# Patient Record
Sex: Female | Born: 1985 | Hispanic: No | Marital: Single | Smoking: Never smoker
Health system: Southern US, Community
[De-identification: ages and names within clinical notes are randomized; demographics above are authoritative.]

## PROBLEM LIST (undated history)

## (undated) DIAGNOSIS — F419 Anxiety disorder, unspecified: Secondary | ICD-10-CM

## (undated) DIAGNOSIS — F909 Attention-deficit hyperactivity disorder, unspecified type: Secondary | ICD-10-CM

## (undated) DIAGNOSIS — J45909 Unspecified asthma, uncomplicated: Secondary | ICD-10-CM

## (undated) HISTORY — PX: LIPOSUCTION: SHX10

## (undated) HISTORY — PX: BREAST ENHANCEMENT SURGERY: SHX7

---

## 2020-10-12 NOTE — L&D Delivery Note (Signed)
Delivery Note At 11:31 AM a viable and healthy female was delivered via Vaginal, Spontaneous (Presentation: Right occiput anterior ).  APGAR: 8, 9; weight pending .   Placenta status:  spontaneous, intact .  Cord: 3V    The patient pushed for 16 minutes and delivered a vigorous female infant in the ROA presentation with apgar scores of 8 at 1 minute and 9 at 5 minutes. Prior to delivery thick meconium stained fluid was noted and the NICU team was called to attend the delivery as a precaution. The fetal head was easily delivered followed by the anterior and posterior shoulders. The infant was immediately passed to the maternal abdomen, was bulb suctioned, and cried easily. The NICU team was not required. Following a 1 minute delay, the cord was clamped and cut. The placenta delivered spontaneously, intact, with 3VC. A second degree perineal laceration was repaired with 3-0 vicryl. All sponge, needle, and instrument counts were correct. EBL 449 cc  Anesthesia: Epidural Episiotomy: None Lacerations:  2nd degree perineal  Suture Repair: 3.0 vicryl Est. Blood Loss (mL):  449 cc  Mom to postpartum.  Baby to Couplet care / Skin to Skin.  Waynard Reeds 05/07/2021, 11:54 AM

## 2021-03-05 LAB — OB RESULTS CONSOLE VARICELLA ZOSTER ANTIBODY, IGG: Varicella: IMMUNE

## 2021-03-05 LAB — OB RESULTS CONSOLE RUBELLA ANTIBODY, IGM: Rubella: NON-IMMUNE/NOT IMMUNE

## 2021-03-05 LAB — OB RESULTS CONSOLE GC/CHLAMYDIA
Chlamydia: NEGATIVE
Gonorrhea: NEGATIVE

## 2021-03-05 LAB — OB RESULTS CONSOLE HIV ANTIBODY (ROUTINE TESTING): HIV: NONREACTIVE

## 2021-03-05 LAB — OB RESULTS CONSOLE HEPATITIS B SURFACE ANTIGEN: Hepatitis B Surface Ag: NEGATIVE

## 2021-03-05 LAB — OB RESULTS CONSOLE RPR: RPR: NONREACTIVE

## 2021-04-09 LAB — OB RESULTS CONSOLE GBS: GBS: NEGATIVE

## 2021-04-11 ENCOUNTER — Inpatient Hospital Stay (HOSPITAL_COMMUNITY)
Admission: AD | Admit: 2021-04-11 | Payer: Medicaid Other | Source: Home / Self Care | Admitting: Obstetrics and Gynecology

## 2021-05-07 ENCOUNTER — Inpatient Hospital Stay (HOSPITAL_COMMUNITY): Payer: Medicaid Other | Admitting: Anesthesiology

## 2021-05-07 ENCOUNTER — Encounter (HOSPITAL_COMMUNITY): Payer: Self-pay | Admitting: Obstetrics and Gynecology

## 2021-05-07 ENCOUNTER — Inpatient Hospital Stay (HOSPITAL_COMMUNITY)
Admission: AD | Admit: 2021-05-07 | Discharge: 2021-05-09 | DRG: 806 | Disposition: A | Discharge status: Home / Self Care | Payer: Medicaid Other | Attending: Obstetrics and Gynecology | Admitting: Obstetrics and Gynecology

## 2021-05-07 ENCOUNTER — Other Ambulatory Visit: Payer: Self-pay

## 2021-05-07 DIAGNOSIS — O26893 Other specified pregnancy related conditions, third trimester: Secondary | ICD-10-CM | POA: Diagnosis present

## 2021-05-07 DIAGNOSIS — Z20822 Contact with and (suspected) exposure to covid-19: Secondary | ICD-10-CM | POA: Diagnosis present

## 2021-05-07 DIAGNOSIS — D62 Acute posthemorrhagic anemia: Secondary | ICD-10-CM | POA: Diagnosis not present

## 2021-05-07 DIAGNOSIS — O9081 Anemia of the puerperium: Secondary | ICD-10-CM | POA: Diagnosis not present

## 2021-05-07 DIAGNOSIS — Z23 Encounter for immunization: Secondary | ICD-10-CM

## 2021-05-07 DIAGNOSIS — Z3A40 40 weeks gestation of pregnancy: Secondary | ICD-10-CM | POA: Diagnosis not present

## 2021-05-07 HISTORY — DX: Anxiety disorder, unspecified: F41.9

## 2021-05-07 HISTORY — DX: Unspecified asthma, uncomplicated: J45.909

## 2021-05-07 LAB — RESP PANEL BY RT-PCR (FLU A&B, COVID) ARPGX2
Influenza A by PCR: NEGATIVE
Influenza B by PCR: NEGATIVE
SARS Coronavirus 2 by RT PCR: NEGATIVE

## 2021-05-07 LAB — CBC
HCT: 35.8 % — ABNORMAL LOW (ref 36.0–46.0)
Hemoglobin: 12.2 g/dL (ref 12.0–15.0)
MCH: 31 pg (ref 26.0–34.0)
MCHC: 34.1 g/dL (ref 30.0–36.0)
MCV: 90.9 fL (ref 80.0–100.0)
Platelets: 296 10*3/uL (ref 150–400)
RBC: 3.94 MIL/uL (ref 3.87–5.11)
RDW: 13.5 % (ref 11.5–15.5)
WBC: 13.7 10*3/uL — ABNORMAL HIGH (ref 4.0–10.5)
nRBC: 0 % (ref 0.0–0.2)

## 2021-05-07 LAB — TYPE AND SCREEN
ABO/RH(D): O POS
Antibody Screen: NEGATIVE

## 2021-05-07 LAB — POCT FERN TEST: POCT Fern Test: POSITIVE

## 2021-05-07 LAB — RPR: RPR Ser Ql: NONREACTIVE

## 2021-05-07 MED ORDER — IBUPROFEN 600 MG PO TABS
600.0000 mg | ORAL_TABLET | Freq: Four times a day (QID) | ORAL | Status: DC
  Administered 2021-05-07 – 2021-05-09 (×6): 600 mg via ORAL
  Filled 2021-05-07 (×7): qty 1

## 2021-05-07 MED ORDER — LACTATED RINGERS IV SOLN: 500.0000 mL | INTRAVENOUS | Status: DC | PRN

## 2021-05-07 MED ORDER — FENTANYL-BUPIVACAINE-NACL 0.5-0.125-0.9 MG/250ML-% EP SOLN
12.0000 mL/h | EPIDURAL | Status: DC | PRN
Start: 2021-05-07 — End: 2021-05-07
  Administered 2021-05-07: 12 mL/h via EPIDURAL
  Filled 2021-05-07: qty 250

## 2021-05-07 MED ORDER — BUTORPHANOL TARTRATE 1 MG/ML IJ SOLN
1.0000 mg | INTRAMUSCULAR | Status: DC | PRN
  Administered 2021-05-07 (×2): 1 mg via INTRAVENOUS
  Filled 2021-05-07 (×2): qty 1

## 2021-05-07 MED ORDER — LIDOCAINE-EPINEPHRINE (PF) 2 %-1:200000 IJ SOLN
INTRAMUSCULAR | Status: DC | PRN
  Administered 2021-05-07: 5 mL via EPIDURAL

## 2021-05-07 MED ORDER — ONDANSETRON HCL 4 MG/2ML IJ SOLN: 4.0000 mg | Freq: Four times a day (QID) | INTRAMUSCULAR | Status: DC | PRN

## 2021-05-07 MED ORDER — METHYLERGONOVINE MALEATE 0.2 MG/ML IJ SOLN: 0.2000 mg | INTRAMUSCULAR | Status: DC | PRN

## 2021-05-07 MED ORDER — FLEET ENEMA 7-19 GM/118ML RE ENEM: 1.0000 | ENEMA | RECTAL | Status: DC | PRN

## 2021-05-07 MED ORDER — OXYCODONE HCL 5 MG PO TABS: 10.0000 mg | ORAL_TABLET | ORAL | Status: DC | PRN

## 2021-05-07 MED ORDER — BENZOCAINE-MENTHOL 20-0.5 % EX AERO
1.0000 "application " | INHALATION_SPRAY | CUTANEOUS | Status: DC | PRN
  Administered 2021-05-07: 1 via TOPICAL
  Filled 2021-05-07: qty 56

## 2021-05-07 MED ORDER — PRENATAL MULTIVITAMIN CH
1.0000 | ORAL_TABLET | Freq: Every day | ORAL | Status: DC
  Administered 2021-05-08: 1 via ORAL
  Filled 2021-05-07: qty 1

## 2021-05-07 MED ORDER — LACTATED RINGERS IV SOLN
500.0000 mL | Freq: Once | INTRAVENOUS | Status: AC
  Administered 2021-05-07: 500 mL via INTRAVENOUS

## 2021-05-07 MED ORDER — OXYCODONE-ACETAMINOPHEN 5-325 MG PO TABS: 2.0000 | ORAL_TABLET | ORAL | Status: DC | PRN

## 2021-05-07 MED ORDER — OXYCODONE HCL 5 MG PO TABS: 5.0000 mg | ORAL_TABLET | ORAL | Status: DC | PRN

## 2021-05-07 MED ORDER — ONDANSETRON HCL 4 MG/2ML IJ SOLN: 4.0000 mg | INTRAMUSCULAR | Status: DC | PRN

## 2021-05-07 MED ORDER — SOD CITRATE-CITRIC ACID 500-334 MG/5ML PO SOLN: 30.0000 mL | ORAL | Status: DC | PRN

## 2021-05-07 MED ORDER — DIBUCAINE (PERIANAL) 1 % EX OINT: 1.0000 "application " | TOPICAL_OINTMENT | CUTANEOUS | Status: DC | PRN

## 2021-05-07 MED ORDER — LACTATED RINGERS IV SOLN: INTRAVENOUS | Status: DC

## 2021-05-07 MED ORDER — TETANUS-DIPHTH-ACELL PERTUSSIS 5-2.5-18.5 LF-MCG/0.5 IM SUSY: 0.5000 mL | PREFILLED_SYRINGE | Freq: Once | INTRAMUSCULAR | Status: DC

## 2021-05-07 MED ORDER — OXYTOCIN-SODIUM CHLORIDE 30-0.9 UT/500ML-% IV SOLN: 2.5000 [IU]/h | INTRAVENOUS | Status: DC

## 2021-05-07 MED ORDER — ACETAMINOPHEN 325 MG PO TABS
650.0000 mg | ORAL_TABLET | ORAL | Status: DC | PRN
  Administered 2021-05-07 – 2021-05-09 (×2): 650 mg via ORAL
  Filled 2021-05-07: qty 2

## 2021-05-07 MED ORDER — SENNOSIDES-DOCUSATE SODIUM 8.6-50 MG PO TABS
2.0000 | ORAL_TABLET | Freq: Every day | ORAL | Status: DC
  Administered 2021-05-08: 2 via ORAL
  Filled 2021-05-07: qty 2

## 2021-05-07 MED ORDER — ACETAMINOPHEN 325 MG PO TABS: 650.0000 mg | ORAL_TABLET | ORAL | Status: DC | PRN

## 2021-05-07 MED ORDER — LIDOCAINE HCL (PF) 1 % IJ SOLN: 30.0000 mL | INTRAMUSCULAR | Status: DC | PRN

## 2021-05-07 MED ORDER — WITCH HAZEL-GLYCERIN EX PADS: 1.0000 "application " | MEDICATED_PAD | CUTANEOUS | Status: DC | PRN

## 2021-05-07 MED ORDER — PHENYLEPHRINE 40 MCG/ML (10ML) SYRINGE FOR IV PUSH (FOR BLOOD PRESSURE SUPPORT)
80.0000 ug | PREFILLED_SYRINGE | INTRAVENOUS | Status: DC | PRN
Start: 2021-05-07 — End: 2021-05-07

## 2021-05-07 MED ORDER — ONDANSETRON HCL 4 MG PO TABS: 4.0000 mg | ORAL_TABLET | ORAL | Status: DC | PRN

## 2021-05-07 MED ORDER — DIPHENHYDRAMINE HCL 50 MG/ML IJ SOLN
12.5000 mg | INTRAMUSCULAR | Status: DC | PRN
Start: 2021-05-07 — End: 2021-05-07

## 2021-05-07 MED ORDER — DIPHENHYDRAMINE HCL 25 MG PO CAPS: 25.0000 mg | ORAL_CAPSULE | Freq: Four times a day (QID) | ORAL | Status: DC | PRN

## 2021-05-07 MED ORDER — EPHEDRINE 5 MG/ML INJ
10.0000 mg | INTRAVENOUS | Status: DC | PRN
Start: 2021-05-07 — End: 2021-05-07

## 2021-05-07 MED ORDER — METHYLERGONOVINE MALEATE 0.2 MG PO TABS: 0.2000 mg | ORAL_TABLET | ORAL | Status: DC | PRN

## 2021-05-07 MED ORDER — OXYCODONE-ACETAMINOPHEN 5-325 MG PO TABS: 1.0000 | ORAL_TABLET | ORAL | Status: DC | PRN

## 2021-05-07 MED ORDER — ZOLPIDEM TARTRATE 5 MG PO TABS: 5.0000 mg | ORAL_TABLET | Freq: Every evening | ORAL | Status: DC | PRN

## 2021-05-07 MED ORDER — PHENYLEPHRINE 40 MCG/ML (10ML) SYRINGE FOR IV PUSH (FOR BLOOD PRESSURE SUPPORT): 80.0000 ug | PREFILLED_SYRINGE | INTRAVENOUS | Status: DC | PRN

## 2021-05-07 MED ORDER — OXYTOCIN-SODIUM CHLORIDE 30-0.9 UT/500ML-% IV SOLN: 2.5000 [IU]/h | INTRAVENOUS | Status: DC | PRN

## 2021-05-07 MED ORDER — EPHEDRINE 5 MG/ML INJ: 10.0000 mg | INTRAVENOUS | Status: DC | PRN

## 2021-05-07 MED ORDER — SIMETHICONE 80 MG PO CHEW: 80.0000 mg | CHEWABLE_TABLET | ORAL | Status: DC | PRN

## 2021-05-07 MED ORDER — COCONUT OIL OIL: 1.0000 "application " | TOPICAL_OIL | Status: DC | PRN

## 2021-05-07 MED ORDER — OXYTOCIN-SODIUM CHLORIDE 30-0.9 UT/500ML-% IV SOLN
1.0000 m[IU]/min | INTRAVENOUS | Status: DC
  Administered 2021-05-07: 2 m[IU]/min via INTRAVENOUS
  Filled 2021-05-07: qty 500

## 2021-05-07 MED ORDER — OXYTOCIN BOLUS FROM INFUSION
333.0000 mL | Freq: Once | INTRAVENOUS | Status: AC
  Administered 2021-05-07: 333 mL via INTRAVENOUS

## 2021-05-07 NOTE — Anesthesia Procedure Notes (Signed)
Epidural Patient location during procedure: OB Start time: 05/07/2021 7:30 AM End time: 05/07/2021 7:40 AM  Staffing Anesthesiologist: Elmer Picker, MD Performed: anesthesiologist   Preanesthetic Checklist Completed: patient identified, IV checked, risks and benefits discussed, monitors and equipment checked, pre-op evaluation and timeout performed  Epidural Patient position: sitting Prep: DuraPrep and site prepped and draped Patient monitoring: continuous pulse ox, blood pressure, heart rate and cardiac monitor Approach: midline Location: L3-L4 Injection technique: LOR air  Needle:  Needle type: Tuohy  Needle gauge: 17 G Needle length: 9 cm Needle insertion depth: 5 cm Catheter type: closed end flexible Catheter size: 19 Gauge Catheter at skin depth: 10 cm Test dose: negative  Assessment Sensory level: T8 Events: blood not aspirated, injection not painful, no injection resistance, no paresthesia and negative IV test  Additional Notes Patient identified. Risks/Benefits/Options discussed with patient including but not limited to bleeding, infection, nerve damage, paralysis, failed block, incomplete pain control, headache, blood pressure changes, nausea, vomiting, reactions to medication both or allergic, itching and postpartum back pain. Confirmed with bedside nurse the patient's most recent platelet count. Confirmed with patient that they are not currently taking any anticoagulation, have any bleeding history or any family history of bleeding disorders. Patient expressed understanding and wished to proceed. All questions were answered. Sterile technique was used throughout the entire procedure. Please see nursing notes for vital signs. Test dose was given through epidural catheter and negative prior to continuing to dose epidural or start infusion. Warning signs of high block given to the patient including shortness of breath, tingling/numbness in hands, complete motor block,  or any concerning symptoms with instructions to call for help. Patient was given instructions on fall risk and not to get out of bed. All questions and concerns addressed with instructions to call with any issues or inadequate analgesia.  Reason for block:procedure for pain

## 2021-05-07 NOTE — MAU Note (Signed)
.  Claudia Sharp is a 35 y.o. at [redacted]w[redacted]d here in MAU reporting: CTX staring around 1200 05/06/2021. Pt states her water broke around 1100 05/06/2021, with clear fluid. Denies abnormal vaginal discharge or bleeding. +FM  Onset of complaint: 05/06/2021 Pain score: 8/10 Vitals:   05/07/21 0244  BP: 129/84  Pulse: 71  Resp: 18  Temp: 98.6 F (37 C)  SpO2: 100%     FHT:140 Lab orders placed from triage: fern

## 2021-05-07 NOTE — Anesthesia Preprocedure Evaluation (Signed)
Anesthesia Evaluation  Patient identified by MRN, date of birth, ID band Patient awake    Reviewed: Allergy & Precautions, NPO status , Patient's Chart, lab work & pertinent test results  Airway Mallampati: II  TM Distance: >3 FB Neck ROM: Full    Dental no notable dental hx.    Pulmonary asthma ,    Pulmonary exam normal breath sounds clear to auscultation       Cardiovascular negative cardio ROS Normal cardiovascular exam Rhythm:Regular Rate:Normal     Neuro/Psych PSYCHIATRIC DISORDERS Anxiety negative neurological ROS     GI/Hepatic negative GI ROS, Neg liver ROS,   Endo/Other  negative endocrine ROS  Renal/GU negative Renal ROS  negative genitourinary   Musculoskeletal negative musculoskeletal ROS (+)   Abdominal   Peds  Hematology negative hematology ROS (+)   Anesthesia Other Findings   Reproductive/Obstetrics (+) Pregnancy                             Anesthesia Physical Anesthesia Plan  ASA: 2  Anesthesia Plan: Epidural   Post-op Pain Management:    Induction:   PONV Risk Score and Plan: Treatment may vary due to age or medical condition  Airway Management Planned: Natural Airway  Additional Equipment:   Intra-op Plan:   Post-operative Plan:   Informed Consent: I have reviewed the patients History and Physical, chart, labs and discussed the procedure including the risks, benefits and alternatives for the proposed anesthesia with the patient or authorized representative who has indicated his/her understanding and acceptance.       Plan Discussed with: Anesthesiologist  Anesthesia Plan Comments: (Patient identified. Risks, benefits, options discussed with patient including but not limited to bleeding, infection, nerve damage, paralysis, failed block, incomplete pain control, headache, blood pressure changes, nausea, vomiting, reactions to medication, itching, and  post partum back pain. Confirmed with bedside nurse the patient's most recent platelet count. Confirmed with the patient that they are not taking any anticoagulation, have any bleeding history or any family history of bleeding disorders. Patient expressed understanding and wishes to proceed. All questions were answered. )        Anesthesia Quick Evaluation

## 2021-05-07 NOTE — H&P (Signed)
Claudia Sharp is a 35 y.o. female presenting for leaking fluid  35 yo [id number]@ 40+0 presents c/o leaking fluid and was confirmed SROM in MAU. Her pregnancy has been uncomplicated to this point./ She transferred care @ 29 weeks OB History     Gravida  2   Para  1   Term  1   Preterm      AB      Living  1      SAB      IAB      Ectopic      Multiple      Live Births  1          Past Medical History:  Diagnosis Date   Anxiety    Asthma    Past Surgical History:  Procedure Laterality Date   BREAST ENHANCEMENT SURGERY     LIPOSUCTION     Family History: family history is not on file. Social History:  reports that she has never smoked. She has never been exposed to tobacco smoke. She has never used smokeless tobacco. She reports previous alcohol use. She reports that she does not use drugs.     Maternal Diabetes: No Genetic Screening: Normal Maternal Ultrasounds/Referrals: Normal Fetal Ultrasounds or other Referrals:  None Maternal Substance Abuse:  No Significant Maternal Medications:  None Significant Maternal Lab Results:  None Other Comments:  None  Review of Systems History Dilation: 8 Effacement (%): 90 Station: 0 Exam by:: Kris Hartmann, RN Blood pressure 119/88, pulse 88, temperature 98.6 F (37 C), temperature source Oral, resp. rate 16, height 5\' 3"  (1.6 m), weight 79.4 kg, last menstrual period 07/31/2020, SpO2 100 %. Exam Physical Exam  Prenatal labs: ABO, Rh: --/--/O POS (07/27 0410) Antibody: NEG (07/27 0410) Rubella: Nonimmune (05/25 0000) RPR: Nonreactive (05/25 0000)  HBsAg:   NR HIV: Non-reactive (05/25 0000)  GBS: Negative/-- (06/29 0000)   Assessment/Plan: 1) admit 2) Epidural on request   01-12-1985 05/07/2021, 9:46 AM

## 2021-05-07 NOTE — Lactation Note (Signed)
This note was copied from a baby's chart. Lactation Consultation Note  Patient Name: Claudia Sharp Today's Date: 05/07/2021   Age:35 hours  Per L&D RN, Yehuda Budd), Mom's feeding intention is formula feeding.   Lurline Hare [facility] 05/07/2021, 12:50 PM

## 2021-05-08 LAB — CBC
HCT: 28.6 % — ABNORMAL LOW (ref 36.0–46.0)
Hemoglobin: 9.5 g/dL — ABNORMAL LOW (ref 12.0–15.0)
MCH: 30.9 pg (ref 26.0–34.0)
MCHC: 33.2 g/dL (ref 30.0–36.0)
MCV: 93.2 fL (ref 80.0–100.0)
Platelets: 220 10*3/uL (ref 150–400)
RBC: 3.07 MIL/uL — ABNORMAL LOW (ref 3.87–5.11)
RDW: 13.9 % (ref 11.5–15.5)
WBC: 14.2 10*3/uL — ABNORMAL HIGH (ref 4.0–10.5)
nRBC: 0 % (ref 0.0–0.2)

## 2021-05-08 MED ORDER — MEASLES, MUMPS & RUBELLA VAC IJ SOLR
0.5000 mL | Freq: Once | INTRAMUSCULAR | Status: AC
  Administered 2021-05-09: 0.5 mL via SUBCUTANEOUS
  Filled 2021-05-08: qty 0.5

## 2021-05-08 NOTE — Discharge Summary (Addendum)
Postpartum Discharge Summary   Patient Name: Claudia Sharp DOB: November 16, 1985 MRN: [id number]  Date of admission: 05/07/2021 Delivery date:05/07/2021  Delivering provider: Vanessa Kick  Date of discharge: 05/09/2021  Admitting diagnosis: Normal labor [O80, Z37.9] Spontaneous vaginal delivery [O80] Intrauterine pregnancy: [redacted]w[redacted]d     Secondary diagnosis:  Active Problems:   Normal labor   Spontaneous vaginal delivery  Additional problems: none    Discharge diagnosis: Term Pregnancy Delivered                                              Post partum procedures: none Augmentation: pitocine Complications: None  Hospital course: Onset of Labor With Vaginal Delivery      35 y.o. yo G2P2002 at [redacted]w[redacted]d was admitted in Active Labor on 05/07/2021. Patient had an uncomplicated labor course as follows:  Membrane Rupture Time/Date: 9:46 AM ,05/07/2021   Delivery Method:Vaginal, Spontaneous  Episiotomy: None  Lacerations:  1st degree  Patient had an uncomplicated postpartum course.  She is ambulating, tolerating a regular diet, passing flatus, and urinating well. Patient is discharged home in stable condition on 05/09/21.  Newborn Data: Birth date:05/07/2021  Birth time:11:31 AM  Gender:Female  Living status:Living  Apgars:9 ,9  Weight:3005 g   Magnesium Sulfate received: No BMZ received: No Rhophylac:No MMR:Yes, given 05/09/21 T-DaP:Given postpartum Flu: No Transfusion:No  Physical exam  Vitals:   05/08/21 0547 05/08/21 1301 05/08/21 2214 05/09/21 0534  BP: (!) 93/57 101/71 115/68 105/80  Pulse: 73 85 71 75  Resp: $Remo'16 16 17 16  'rVhHv$ Temp: 98.2 F (36.8 C) 98.7 F (37.1 C) 98.6 F (37 C) 97.8 F (36.6 C)  TempSrc: Oral Oral Oral Oral  SpO2: 100% 99% 100%   Weight:      Height:        Labs: Lab Results  Component Value Date   WBC 14.2 (H) 05/08/2021   HGB 9.5 (L) 05/08/2021   HCT 28.6 (L) 05/08/2021   MCV 93.2 05/08/2021   PLT 220 05/08/2021   No flowsheet data  found. Edinburgh Score: Edinburgh Postnatal Depression Scale Screening Tool 05/07/2021  I have been able to laugh and see the funny side of things. 0  I have looked forward with enjoyment to things. 0  I have blamed myself unnecessarily when things went wrong. 0  I have been anxious or worried for no good reason. 0  I have felt scared or panicky for no good reason. 0  Things have been getting on top of me. 0  I have been so unhappy that I have had difficulty sleeping. 0  I have felt sad or miserable. 0  I have been so unhappy that I have been crying. 0  The thought of harming myself has occurred to me. 0  Edinburgh Postnatal Depression Scale Total 0      After visit meds:  Allergies as of 05/09/2021   No Known Allergies      Medication List     TAKE these medications    acetaminophen 325 MG tablet Commonly known as: Tylenol Take 2 tablets (650 mg total) by mouth every 4 (four) hours as needed (for pain scale < 4).   ibuprofen 600 MG tablet Commonly known as: ADVIL Take 1 tablet (600 mg total) by mouth every 6 (six) hours.  Discharge Care Instructions  (From admission, onward)           Start     Ordered   05/08/21 0000  Discharge wound care:       Comments: Sitz baths and icepacks to perineum.  If stitches, they will dissolve.   05/08/21 0728             Discharge home in stable condition Infant Feeding:  ? Infant Disposition:home with mother Discharge instruction: per After Visit Summary and Postpartum booklet. Activity: Advance as tolerated. Pelvic rest for 6 weeks.  Diet: routine diet Anticipated Birth Control: Unsure Postpartum Appointment:4 weeks Additional Postpartum F/U:  none Future Appointments:No future appointments. Follow up Visit:  Follow-up Information     Vanessa Kick, MD Follow up in 4 week(s).   Specialty: Obstetrics and Gynecology Contact information: [address] [place]  [id number] [phone]                     05/09/2021 Jonelle Sidle, MD

## 2021-05-08 NOTE — Social Work (Signed)
CSW received consult for hx of Anxiety and Depression.  CSW met with MOB to offer support and complete assessment.     CSW introduced self and role. CSW observed FOB Claudia Sharp bedside and infant in bassinet. MOB provided permission complete assessment with FOB present. CSW informed MOB of reason for consult and assessed current emotions. MOB reported she is currently doing well. CSW inquired on MOB mental health history. MOB reported she was diagnosed with anxiety and ADHD in 2005. MOB stated she was taking two medications, which she stopped with the pregnancy. MOB shared she was also previously engaged in therapy services, however she recently moved from [place], [place]  and has not yet connected to mental health resources.in the area. MOB stated both therapy and medications were helpful. CSW expressed understanding and provided MOB with local mental health resources. MOB identified her family, which she talks to daily as supportive. MOB denies any current SI or HI.   CSW provided education regarding the baby blues period versus PPD. CSW provided the New Mom Checklist and encouraged MOB to self evaluate and contact a medical professional if symptoms are noted at any time.   CSW provided review of Sudden Infant Death Syndrome (SIDS) precautions. MOB has all infant needs, including a packn'play. MOB identified [facility] for follow-up care and denies any barriers to care. MOB stated she has no additional needs at this time.  CSW identifies no further need for intervention and no barriers to discharge at this time.  Claudia Sharp, [place] [employer] [phone]

## 2021-05-08 NOTE — Anesthesia Postprocedure Evaluation (Signed)
Anesthesia Post Note  Patient: Claudia Sharp  Procedure(s) Performed: AN AD HOC LABOR EPIDURAL     Patient location during evaluation: Mother Baby Anesthesia Type: Epidural Level of consciousness: awake and alert Pain management: pain level controlled Vital Signs Assessment: post-procedure vital signs reviewed and stable Respiratory status: spontaneous breathing, nonlabored ventilation and respiratory function stable Cardiovascular status: stable Postop Assessment: no headache, no backache, epidural receding, patient able to bend at knees, no apparent nausea or vomiting, able to ambulate and adequate PO intake Anesthetic complications: no   No notable events documented.  Last Vitals:  Vitals:   05/08/21 0547 05/08/21 1301  BP: (!) 93/57 101/71  Pulse: 73 85  Resp: 16 16  Temp: 36.8 C 37.1 C  SpO2: 100% 99%    Last Pain:  Vitals:   05/08/21 1805  TempSrc:   PainSc: 0-No pain   Pain Goal:                   [employer]

## 2021-05-08 NOTE — Progress Notes (Addendum)
Patient is eating, ambulating, voiding.  Pain control is good.  Vitals:   05/07/21 1725 05/07/21 2145 05/08/21 0135 05/08/21 0547  BP: 98/78 114/82 121/88 (!) 93/57  Pulse: (!) 104 84 84 73  Resp: $Remo'20 18 18 16  'LJocR$ Temp: 98.3 F (36.8 C) 98.5 F (36.9 C) 98.4 F (36.9 C) 98.2 F (36.8 C)  TempSrc: Oral Oral Oral Oral  SpO2:  99% 99% 100%  Weight:      Height:        Fundus firm Perineum without swelling.  Lab Results  Component Value Date   WBC 14.2 (H) 05/08/2021   HGB 9.5 (L) 05/08/2021   HCT 28.6 (L) 05/08/2021   MCV 93.2 05/08/2021   PLT 220 05/08/2021    --/--/O POS (07/27 0410)/RNI  A/P Post partum day 1.  Routine care.  Expect d/c routine.  MMR ordered.  Daria Pastures

## 2021-05-09 MED ORDER — ACETAMINOPHEN 325 MG PO TABS: 650.0000 mg | ORAL_TABLET | ORAL | 1 refills | Status: DC | PRN

## 2021-05-09 MED ORDER — IBUPROFEN 600 MG PO TABS
600.0000 mg | ORAL_TABLET | Freq: Four times a day (QID) | ORAL | 1 refills | Status: DC
Start: 2021-05-09 — End: 2021-11-04

## 2021-05-09 NOTE — Progress Notes (Signed)
Post Partum Day 2 Subjective: no complaints, up ad lib, voiding, and tolerating PO  Objective: Patient Vitals for the past 24 hrs:  BP Temp Temp src Pulse Resp SpO2  05/09/21 0534 105/80 97.8 F (36.6 C) Oral 75 16 --  05/08/21 2214 115/68 98.6 F (37 C) Oral 71 17 100 %  05/08/21 1301 101/71 98.7 F (37.1 C) Oral 85 16 99 %    Physical Exam:  General: alert and no distress Lochia: appropriate Uterine Fundus: firm DVT Evaluation: No evidence of DVT seen on physical exam.  Recent Labs    05/07/21 0354 05/08/21 0632  WBC 13.7* 14.2*  HGB 12.2 9.5*  HCT 35.8* 28.6*  PLT 296 220    Assessment/Plan: Discharge home  Claudia Sharp 35 y.o. G2P2002 PPD#2 sp TSVD 1. ABLA: EBL 449cc, 2nd degree repaired. Hgb 12.2>9.5, discussed continuing PO iron 2 Rubella nonimmune, patient accepts MMR PP blood type O POS, bottle feeding, baby girl in room, birth control - undecided. Vaccines: tdap, COVID series completed, discussed booster   LOS: 2 days   [place] Brogden K Taam-Akelman 05/09/2021, 8:42 AM

## 2021-05-21 ENCOUNTER — Telehealth (HOSPITAL_COMMUNITY): Payer: Self-pay

## 2021-05-21 NOTE — Telephone Encounter (Signed)
"  I'm doing good. I'm recovering good. I have a good amount of energy." Patient has no questions or concerns about her healing.  "She's good. She is feeding well. She sleeps in a pack n play." RN provided safe sleep and SIDS prevention education. Patient has no questions or concerns about baby.  EPDS score is 0.  Marcelino Duster [facility] 05/21/2021,1754

## 2021-09-18 DIAGNOSIS — J45909 Unspecified asthma, uncomplicated: Secondary | ICD-10-CM | POA: Insufficient documentation

## 2021-10-08 ENCOUNTER — Other Ambulatory Visit: Payer: Self-pay | Admitting: Obstetrics and Gynecology

## 2021-10-08 DIAGNOSIS — Z363 Encounter for antenatal screening for malformations: Secondary | ICD-10-CM

## 2021-10-22 ENCOUNTER — Encounter: Payer: Self-pay | Admitting: *Deleted

## 2021-10-29 ENCOUNTER — Ambulatory Visit: Payer: Medicaid Other

## 2021-10-29 ENCOUNTER — Ambulatory Visit (HOSPITAL_BASED_OUTPATIENT_CLINIC_OR_DEPARTMENT_OTHER): Payer: Medicaid Other | Admitting: Maternal & Fetal Medicine

## 2021-10-29 ENCOUNTER — Ambulatory Visit: Payer: Medicaid Other | Attending: Obstetrics and Gynecology

## 2021-10-29 ENCOUNTER — Other Ambulatory Visit: Payer: Self-pay | Admitting: Obstetrics and Gynecology

## 2021-10-29 ENCOUNTER — Other Ambulatory Visit: Payer: Self-pay

## 2021-10-29 ENCOUNTER — Other Ambulatory Visit: Payer: Self-pay | Admitting: *Deleted

## 2021-10-29 ENCOUNTER — Encounter: Payer: Self-pay | Admitting: *Deleted

## 2021-10-29 ENCOUNTER — Ambulatory Visit (HOSPITAL_BASED_OUTPATIENT_CLINIC_OR_DEPARTMENT_OTHER): Payer: Medicaid Other

## 2021-10-29 ENCOUNTER — Ambulatory Visit: Payer: Medicaid Other | Admitting: *Deleted

## 2021-10-29 VITALS — BP 117/78 | HR 99

## 2021-10-29 DIAGNOSIS — Z363 Encounter for antenatal screening for malformations: Secondary | ICD-10-CM

## 2021-10-29 DIAGNOSIS — O09522 Supervision of elderly multigravida, second trimester: Secondary | ICD-10-CM | POA: Insufficient documentation

## 2021-10-29 DIAGNOSIS — Z3A36 36 weeks gestation of pregnancy: Secondary | ICD-10-CM

## 2021-10-29 DIAGNOSIS — O365930 Maternal care for other known or suspected poor fetal growth, third trimester, not applicable or unspecified: Secondary | ICD-10-CM

## 2021-10-29 DIAGNOSIS — O280 Abnormal hematological finding on antenatal screening of mother: Secondary | ICD-10-CM

## 2021-10-29 DIAGNOSIS — O0933 Supervision of pregnancy with insufficient antenatal care, third trimester: Secondary | ICD-10-CM

## 2021-10-29 DIAGNOSIS — Z362 Encounter for other antenatal screening follow-up: Secondary | ICD-10-CM

## 2021-10-29 DIAGNOSIS — O285 Abnormal chromosomal and genetic finding on antenatal screening of mother: Secondary | ICD-10-CM

## 2021-10-29 DIAGNOSIS — O99330 Smoking (tobacco) complicating pregnancy, unspecified trimester: Secondary | ICD-10-CM | POA: Diagnosis present

## 2021-10-29 NOTE — Progress Notes (Deleted)
Maternal-Fetal Medicine   Name: Claudia Sharp DOB: 09/20/1996 MRN: [id number] Referring Provider: Merian Capron, MD  I had the pleasure of seeing Ms. Claudia [facility]. She is G2 P1 at 36w 4d gestation with inadequate prenatal care and is here for ultrasound evaluation of her pregnancy.  Although patient has her last menstrual period date, she reports irregular cycles.  Her pregnancy was dated by 14-week ultrasound performed at our radiology department.  However, only BPD measurement was obtained to date her pregnancy.  Obstetric history: In 2008 she had a term vaginal delivery of a female infant weighing 6 pounds and 10 ounces at birth.  Her pregnancy was complicated by preeclampsia.  Past medical history: She does not have hypertension or diabetes. Social history: Patient smokes about 5 cigarettes daily and uses marijuana occasionally.  She does not use alcohol or any other drugs.  Her partner is [place] American, and he is in good health.   Patient has not had screening for fetal aneuploidies.  She is here to screen for gestational diabetes.  Hemoglobin A1c is within normal range (5.6%).  Ultrasound On today's ultrasound, the estimated fetal weight is at less than the 1st percentile.  Head circumference measurement is at between -2 and -3 SD.  Abdominal and femur length measurements are at the 1st percentile.  Amniotic fluid is normal and good fetal activity seen.  Fetal heart arrhythmia is consistent with premature atrial contractions are seen.  Fetal anatomical survey appears normal but limited by advanced gestational age.  Antenatal testing is reassuring.  Umbilical artery Doppler showed normal forward diastolic flow.  NST is reactive.  BPP 10/10.  Fetal growth restriction -I explained the finding of fetal growth restriction.  Even if her pregnancy is dated by last menstrual period, the estimated fetal weight will be at the 2nd percentile. -I explained  the possible causes of fetal growth restriction including placental insufficiency (most common cause), fetal chromosomal anomalies or genetic syndromes, fetal infections (no history of fever or rashes) or unknown causes. I discussed smoking cessation.  Alternatively, she may use nicotine replacement therapy (patches).  Patient is planning to quit smoking. I explained ultrasound monitoring of fetal growth restriction.  Because of late gestational age, unsure pregnancy dating and the finding of severe fetal growth restriction, I have recommended twice weekly antenatal testing. Timing of delivery: Because of her uncertain dates, delivery at [redacted] weeks gestation may result in a premature infant.  I recommend continuing antenatal testing twice weekly and deliver 2 weeks from now. Patient reports she will be unable to come for another visit this week.  Recommendations -BPP, NST and UA Doppler on 11/04/2021. -NST on 11/07/2021. -BPP, NST and UA Doppler on 11/11/2021. -Delivery in the first week of February (38 to 39 weeks' gestation dated by 14-week ultrasound).  Thank you for consultation.  If you have any questions or concerns, please contact me the [facility].  Consultation including face-to-face (more than 50%) counseling 30 minutes.

## 2021-10-29 NOTE — Addendum Note (Signed)
Addended by: Staci Righter on: 10/29/2021 10:19 AM   Modules accepted: Orders

## 2021-10-29 NOTE — Progress Notes (Signed)
Error. Wrong patient note.

## 2021-10-30 NOTE — Progress Notes (Signed)
Name: Claudia Sharp Indication: NIPS positive for Trisomy 62  DOB: 25-Sep-1986 Age: 36 y.o.   EDC: 04/21/2022 LMP: Not known Referring Provider:  Rowland Lathe, MD  EGA: [redacted]w[redacted]d Genetic Counselor: Staci Righter, MS, CGC  OB HxMF:[id number] Date of Appointment: 10/29/2021  Accompanied by: Father of the pregnancy, Raymond Face to Face Time: 19 Minutes   Previous Testing Completed: CBC from 09/24/2021 reviewed. MCV within normal limits. It is unlikely that Darthula is a beta thalassemia carrier or an alpha thalassemia carrier of the double-gene deletion. Individuals with a normal MCV may be single-gene deletion carriers, but it is unlikely that the current pregnancy would be affected with alpha or beta thalassemia major. Hemoglobin electrophoresis completed on 09/24/2021. No abnormal hemoglobin bands noted.      Genetic Counseling:   Down syndrome suspected in the current pregnancy: The high-risk NIPS result was reviewed with the couple in the genetic counseling session. We discussed that while the NIPS Positive Predictive Value (PPV) is 79%, given the screening nature of NIPS we cannot say with [id number] certainty that the current pregnancy is affected with Down syndrome. If the current pregnancy is found to have Down syndrome it most likely does not have an inherited form of Down syndrome, rather it most likely occurred by chance during sperm or egg formation (nondisjunction). Common manifestations individuals with Down syndrome have include mild to moderate intellectual disability, delayed achievement of milestones, poor muscle tone, heart defects, etc. All individuals with Down syndrome benefit from early intervention to help with the development of their speech as well as their fine- and gross-motor skills. Additionally, all individuals with Down syndrome need life-long care and assistance. Donald and her partner were appropriately emotional about the high risk result and opted to pursue  amniocentesis for prenatal diagnosis. They shared with genetic counseling that if the current pregnancy is confirmed to be affected with Down syndrome they will want to opt for termination of pregnancy. Genetic counseling reviewed the gestational limits for termination of pregnancy in the state of [place] with the couple. They appeared to understand all that was discussed in the genetic counseling session. Rogelio signed the 72 hour termination consent form on 10/29/2021 at 10:02am (witnessed by her partner and genetic counseling). This signed consent form will be scanned into her chart under the Media tab of Epic.   Birth Defects. All babies have approximately a 3-5% risk for a birth defect and a majority of these defects cannot be detected through the screening or diagnostic testing listed below. Ultrasound may detect some birth defects, but it may not detect all birth defects. About half of pregnancies with Down syndrome do not show any soft markers on ultrasound. A normal ultrasound does not guarantee a healthy pregnancy.   Testing/Screening Options:   Amniocentesis. This procedure is available for prenatal diagnosis. Possible procedural difficulties and complications that can arise include maternal infection, cramping, bleeding, fluid leakage, and/or pregnancy loss. The risk for pregnancy loss with an amniocentesis is 1/500. Per the [employer] (ACOG) Practice Bulletin 162, all pregnant women should be offered prenatal assessment for aneuploidy by diagnostic testing regardless of maternal age or other risk factors. If indicated, genetic testing that could be ordered on an amniocentesis sample includes a fetal karyotype, fetal microarray, and testing for specific syndromes.    Patient Plan:  Proceed with: Amniocentesis for prenatal diagnosis.  Informed consent was obtained. All questions were answered.    Thank you for sharing in the care  of Claudia Sharp  with [place].  Please do not hesitate to contact us if you have any questions.  Staci Righter, MS, [facility]

## 2021-10-30 NOTE — Addendum Note (Signed)
Addended by: Novella Olive on: 10/30/2021 10:15 AM   Modules accepted: Level of Service

## 2021-10-30 NOTE — Progress Notes (Signed)
MFM Brief Note  Ms. Claudia Sharp is a 36 yo G3P2 who is here for an early anatomy and amniocentesis given an NIPT result positive for Trisomy 21.  She is seen today at the request of Claudia Pap, MD  Today there was  normal first trimester anatomy with no obvious markers of aneuploidy.  I discussed with Ms. Claudia Sharp the abnormal NIPT and the recommendation of diagnostic amniocentesis.  We discussed the risk of the procedure including perinatal loss 1:500-1:1000 ( vaginal bleeding, loss of fluid or infection) in addition given the early gestation we discussed the increased risk for procedure failure due to unfused membranes. I particularly discussed the consideration of waiting one additional week to 16 weeks. Ms. Claudia Sharp and her significant other expressed an understanding but desired to proceed to obtain early diagnosis as they desire to terminate the pregnancy if the results are confirmatory.    At the end of this discussion, your patient agreed to the procedure and informed consent was obtained.   She met with our genetic counselor prior to the procedure please see Analyssa detailed report in EPIC.  Procedure: Prior to the procedure ultrasound examination was performed, a posterior placenta was identified.  Under continuous ultrasound guidance, a single puncture technique and no amniotic fluid was obtained as the membranes were not fused. The procedure was obandined.  Fetal heart rate was examined both prior and immediately following the procedure, both were reassuring.   Maternal blood type: O+ Therefore, administration of RhoGam was (not) necessary.    We have scheduled Ms. Claudia Sharp to return in 7-10 days to repeat the procedure.  Infection and bleeding precaustions were reviewed.  I spent 30 minutes with >50% in face to face consultation.  Vikki Ports, MD.

## 2021-11-04 ENCOUNTER — Encounter: Payer: Self-pay | Admitting: Neurology

## 2021-11-04 ENCOUNTER — Ambulatory Visit: Payer: Medicaid Other | Admitting: Neurology

## 2021-11-04 ENCOUNTER — Other Ambulatory Visit: Payer: Self-pay

## 2021-11-04 VITALS — BP 108/76 | HR 90 | Ht 63.0 in | Wt 166.0 lb

## 2021-11-04 DIAGNOSIS — G3184 Mild cognitive impairment, so stated: Secondary | ICD-10-CM | POA: Diagnosis not present

## 2021-11-04 NOTE — Progress Notes (Signed)
Chief Complaint  Patient presents with   New Patient (Initial Visit)    Rm 14, alone  NP/Paper/[facility]/Morrow Dowdle PA [phone]/gait instability, decreased spatial awareness, stuttering, and short term memory. Pt is no longer taking adderall, no pcp  Today c/o numbness in both hands, pt is [redacted] weeks pregnant        ASSESSMENT AND PLAN  Claudia Sharp is a 36 y.o. female   Mild cognitive impairment  MoCA examination 21/30 today  Laboratory evaluation to rule out treatable etiology  Will hold off MRI brain, because she is [redacted] weeks pregnant,  This in the setting of anxiety, she is tearful at today's visit,  I have suggested for further evaluation such as neuropsychology testing, she wants to hold off at this point,   DIAGNOSTIC DATA (LABS, IMAGING, TESTING) - I reviewed patient records, labs, notes, testing and imaging myself where available.   MEDICAL HISTORY:  Claudia Sharp 36 year old female, seen in request by her primary care PA Lyn Henri for evaluation of memory loss, stuttering of speech, gait instability, initial evaluation was on November 04, 2021  I reviewed and summarized the referring note. PMHX. Anxiety, psychologist. 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  PHYSICAL EXAMNIATION: tearful at today's visit.  Gen: NAD, conversant, well nourised, well groomed                     Cardiovascular: Regular rate rhythm, no peripheral edema, warm, nontender. Eyes: Conjunctivae clear without exudates or hemorrhage Neck: Supple, no carotid bruits. Pulmonary: Clear to auscultation bilaterally   NEUROLOGICAL EXAM:  MENTAL STATUS: Speech:    Speech is normal; fluent and spontaneous with normal comprehension.  Cognition:     Montreal Cognitive Assessment  11/04/2021  Visuospatial/ Executive (0/5) 4  Naming (0/3) 3  Attention: Read list of digits (0/2) 2  Attention: Read list of letters (0/1) 1  Attention: Serial 7 subtraction starting at 100 (0/3) 0  Language: Repeat phrase (0/2) 2  Language : Fluency (0/1) 0  Abstraction (0/2) 2  Delayed Recall (0/5) 1  Orientation (0/6) 6  Total 21      CRANIAL NERVES: CN II: Visual fields are full to confrontation. Pupils are round equal and briskly reactive to light. CN III, IV, VI: extraocular movement are normal. No ptosis. CN V: Facial sensation is intact to light touch CN VII: Face is symmetric with normal eye closure  CN VIII: Hearing is normal to causal  conversation. CN IX, X: Phonation is normal. CN XI: Head turning and shoulder shrug are intact  MOTOR: There is no pronator drift of out-stretched arms. Muscle bulk and tone are normal. Muscle strength is normal.  REFLEXES: Reflexes are 2+ and symmetric at the biceps, triceps, knees, and ankles. Plantar responses are flexor.  SENSORY: Intact to light touch, pinprick and vibratory sensation are intact in fingers and toes.  COORDINATION: There is no trunk or limb dysmetria noted.  GAIT/STANCE: Posture is  normal. Gait is steady with normal steps, base, arm swing, and turning. Heel and toe walking are normal. Tandem gait is normal.  Romberg is absent.  REVIEW OF SYSTEMS:  Full 14 system review of systems performed and notable only for as above All other review of systems were negative.   ALLERGIES: No Known Allergies  HOME MEDICATIONS: Current Outpatient Medications  Medication Sig Dispense Refill   Prenatal Vit-Fe Fumarate-FA (PRENATAL MULTIVITAMIN) TABS tablet Take 1 tablet by mouth daily at 12 noon.     promethazine (PHENERGAN) 25 MG tablet Take 25 mg by mouth every 6 (six) hours as needed for nausea or vomiting.     No current facility-administered medications for this visit.    PAST MEDICAL HISTORY: Past Medical History:  Diagnosis Date   Anxiety    Asthma     PAST SURGICAL HISTORY: Past Surgical History:  Procedure Laterality Date   BREAST ENHANCEMENT SURGERY     LIPOSUCTION      FAMILY HISTORY: Family History  Problem Relation Age of Onset   Asthma Mother     SOCIAL HISTORY: Social History   Socioeconomic History   Marital status: Single    Spouse name: Not on file   Number of children: 1   Years of education: Not on file   Highest education level: Not on file  Occupational History   Not on file  Tobacco Use   Smoking status: Never    Passive exposure: Never   Smokeless tobacco: Never  Vaping Use   Vaping Use: Former   Substances: Nicotine, Flavoring  Substance and Sexual Activity   Alcohol use: Not Currently   Drug use: Never   Sexual activity: Yes  Other Topics Concern   Not on file  Social History Narrative   Not on file   Social Determinants of Health   Financial Resource Strain: Not on file  Food Insecurity: Not on file  Transportation Needs: Not on file  Physical Activity: Not on file  Stress: Not on file  Social Connections: Not on file  Intimate Partner Violence: Not on file      Marcial Pacas, M.D. Ph.D.  [facility] [address], [place], [place] [address] Ph: [phone] Fax: [phone]  CC:  Layla Barter, PA-C [employer],  [place] [address]  Pcp, No

## 2021-11-05 ENCOUNTER — Other Ambulatory Visit: Payer: Self-pay | Admitting: Maternal & Fetal Medicine

## 2021-11-05 ENCOUNTER — Ambulatory Visit: Payer: Medicaid Other | Attending: Maternal & Fetal Medicine

## 2021-11-05 ENCOUNTER — Ambulatory Visit (HOSPITAL_BASED_OUTPATIENT_CLINIC_OR_DEPARTMENT_OTHER): Payer: Medicaid Other | Admitting: *Deleted

## 2021-11-05 ENCOUNTER — Ambulatory Visit: Payer: Medicaid Other

## 2021-11-05 ENCOUNTER — Other Ambulatory Visit: Payer: Self-pay

## 2021-11-05 VITALS — BP 117/74 | HR 106

## 2021-11-05 DIAGNOSIS — O3513X0 Maternal care for (suspected) chromosomal abnormality in fetus, trisomy 21, not applicable or unspecified: Secondary | ICD-10-CM

## 2021-11-05 DIAGNOSIS — O3509X0 Maternal care for (suspected) other central nervous system malformation or damage in fetus, not applicable or unspecified: Secondary | ICD-10-CM

## 2021-11-05 DIAGNOSIS — Z3A16 16 weeks gestation of pregnancy: Secondary | ICD-10-CM | POA: Diagnosis not present

## 2021-11-05 DIAGNOSIS — O280 Abnormal hematological finding on antenatal screening of mother: Secondary | ICD-10-CM | POA: Diagnosis not present

## 2021-11-05 DIAGNOSIS — Z362 Encounter for other antenatal screening follow-up: Secondary | ICD-10-CM

## 2021-11-05 DIAGNOSIS — O09522 Supervision of elderly multigravida, second trimester: Secondary | ICD-10-CM | POA: Insufficient documentation

## 2021-11-05 DIAGNOSIS — O285 Abnormal chromosomal and genetic finding on antenatal screening of mother: Secondary | ICD-10-CM

## 2021-11-05 LAB — CBC WITH DIFFERENTIAL/PLATELET
Basophils Absolute: 0 10*3/uL (ref 0.0–0.2)
Basos: 0 %
EOS (ABSOLUTE): 0.1 10*3/uL (ref 0.0–0.4)
Eos: 1 %
Hematocrit: 35 % (ref 34.0–46.6)
Hemoglobin: 12 g/dL (ref 11.1–15.9)
Immature Grans (Abs): 0.1 10*3/uL (ref 0.0–0.1)
Immature Granulocytes: 1 %
Lymphocytes Absolute: 2.7 10*3/uL (ref 0.7–3.1)
Lymphs: 26 %
MCH: 31 pg (ref 26.6–33.0)
MCHC: 34.3 g/dL (ref 31.5–35.7)
MCV: 90 fL (ref 79–97)
Monocytes Absolute: 0.7 10*3/uL (ref 0.1–0.9)
Monocytes: 7 %
Neutrophils Absolute: 6.9 10*3/uL (ref 1.4–7.0)
Neutrophils: 65 %
Platelets: 336 10*3/uL (ref 150–450)
RBC: 3.87 x10E6/uL (ref 3.77–5.28)
RDW: 12.9 % (ref 11.7–15.4)
WBC: 10.5 10*3/uL (ref 3.4–10.8)

## 2021-11-05 LAB — VITAMIN B12: Vitamin B-12: 564 pg/mL (ref 232–1245)

## 2021-11-05 LAB — COMPREHENSIVE METABOLIC PANEL
ALT: 10 IU/L (ref 0–32)
AST: 13 IU/L (ref 0–40)
Albumin/Globulin Ratio: 1.4 (ref 1.2–2.2)
Albumin: 3.8 g/dL (ref 3.8–4.8)
Alkaline Phosphatase: 59 IU/L (ref 44–121)
BUN/Creatinine Ratio: 21 (ref 9–23)
BUN: 9 mg/dL (ref 6–20)
Bilirubin Total: <0.2 mg/dL (ref 0.0–1.2)
CO2: 21 mmol/L (ref 20–29)
Calcium: 8.7 mg/dL (ref 8.7–10.2)
Chloride: 102 mmol/L (ref 96–106)
Creatinine, Ser: 0.43 mg/dL — ABNORMAL LOW (ref 0.57–1.00)
Globulin, Total: 2.8 g/dL (ref 1.5–4.5)
Glucose: 83 mg/dL (ref 70–99)
Potassium: 3.9 mmol/L (ref 3.5–5.2)
Sodium: 135 mmol/L (ref 134–144)
Total Protein: 6.6 g/dL (ref 6.0–8.5)
eGFR: 130 mL/min/{1.73_m2} (ref >59)

## 2021-11-05 LAB — RPR: RPR Ser Ql: NONREACTIVE

## 2021-11-05 LAB — TSH: TSH: 0.72 u[IU]/mL (ref 0.450–4.500)

## 2021-11-05 LAB — HIV ANTIBODY (ROUTINE TESTING W REFLEX): HIV Screen 4th Generation wRfx: NONREACTIVE

## 2021-11-05 NOTE — Progress Notes (Signed)
MFM Note  Claudia Sharp was seen for an amniocentesis procedure today for definitive diagnosis of fetal aneuploidy.  Her cell free DNA test indicated an increased risk for Down syndrome.  The patient has received extensive counseling from both myself and the genetic counselor regarding the risks versus benefits of the amniocentesis procedure.  She has stated that she wants the procedure done today for definitive diagnosis.  She denies any problems since her last exam.  After informed consent was obtained, a timeout was performed verifying the patient's identity and the indications for the procedure.  The patient was then prepped and draped in the usual sterile fashion.  An uncomplicated genetic amniocentesis was performed today obtaining 25 cc of straw-colored amniotic fluid which was then sent off for amniotic fluid AFP, FISH, and chromosome analysis.  The patient was advised that our genetic counselor will notify her regarding the results of the amniocentesis.  Post amniocentesis instructions were discussed.  As the patient's blood type is Rh positive, a dose of RhoGam was not given following the procedure.  The patient has stated that she will most likely terminate her pregnancy should her baby be confirmed to have Down syndrome.  She was advised that our genetic counselor will be available to help her make arrangements for the procedure if necessary.  We will arrange for the patient to have a detailed fetal anatomy scan in our office at 19 weeks should she continue her pregnancy.

## 2021-11-07 ENCOUNTER — Telehealth: Payer: Self-pay | Admitting: Genetics

## 2021-11-07 NOTE — Telephone Encounter (Signed)
Called Claudia Sharp to return amniocentesis FISH results. Left voicemail with [facility] call back number.

## 2021-11-07 NOTE — Telephone Encounter (Signed)
Claudia Sharp returned genetic counseling's voicemail. We reviewed that the amniotic fluid FISH analysis revealed three chromosome 21 signals. 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 We reviewed that the final karyotype is pending. When Claudia Sharp was seen for genetic counseling on 10/29/2021 she expressed that if the current pregnancy is confirmed to be affected with Down syndrome she would want to opt for termination of pregnancy. She signed a 72 hour consent form on 10/29/2021 at 10:02am. Genetic counseling reviewed with Claudia Sharp that we will call her on Monday (11/10/2021) to review what the next steps are if she would still like to proceed with termination. Claudia Sharp's current gestational age is [redacted]w[redacted]d.

## 2021-11-10 ENCOUNTER — Telehealth: Payer: Self-pay | Admitting: Genetics

## 2021-11-10 NOTE — Telephone Encounter (Signed)
Called Claudia Sharp to discuss next steps after receiving the positive amnio FISH result. Claudia Sharp verbalized to genetic counseling that she would like to wait for the final karyotype to be resulted before proceeding with termination of pregnancy. Genetic counseling will call [name] when the final karyotype is available.

## 2021-11-13 ENCOUNTER — Telehealth: Payer: Self-pay | Admitting: Genetics

## 2021-11-13 LAB — INSIGHT AMNIO FISH XY,13,18,21
Cells Analyzed: 50
Cells Counted: 50

## 2021-11-13 LAB — AFP, AMNIOTIC FLUID
AFP, Amniotic Fluid (mcg/ml): 10 ug/mL
Gestational Age(Wks): 16
MOM, Amniotic Fluid: 0.74

## 2021-11-13 LAB — CHROMOSOME ANALYSIS W REFLEX TO SNP, AMNIOTIC
Cells Analyzed: 15
Cells Counted: 15
Cells Karyotyped: 2
Colonies: 15
GTG Band Resolution Achieved: 450

## 2021-11-13 NOTE — Telephone Encounter (Signed)
Called Claudia Sharp to return amniocentesis karyotype result. The karyotype result confirmed Down syndrome in the current pregnancy: 47,XY,+21. Claudia Sharp would like to proceed with termination of pregnancy. Genetic counseling will help to facilitate this.

## 2021-11-20 HISTORY — PX: DILATION AND EVACUATION: SHX1459

## 2021-11-26 ENCOUNTER — Ambulatory Visit: Payer: Medicaid Other | Attending: Maternal & Fetal Medicine

## 2021-11-26 ENCOUNTER — Ambulatory Visit: Payer: Medicaid Other

## 2022-01-09 ENCOUNTER — Encounter (HOSPITAL_BASED_OUTPATIENT_CLINIC_OR_DEPARTMENT_OTHER): Payer: Self-pay | Admitting: Obstetrics and Gynecology

## 2022-01-09 ENCOUNTER — Other Ambulatory Visit: Payer: Self-pay

## 2022-01-09 ENCOUNTER — Other Ambulatory Visit: Payer: Self-pay | Admitting: Obstetrics and Gynecology

## 2022-01-09 ENCOUNTER — Other Ambulatory Visit (HOSPITAL_COMMUNITY): Payer: Self-pay | Admitting: Obstetrics and Gynecology

## 2022-01-09 DIAGNOSIS — O074 Failed attempted termination of pregnancy without complication: Secondary | ICD-10-CM

## 2022-01-09 NOTE — Progress Notes (Addendum)
Spoke w/ via phone for pre-op interview---pt ?Lab needs dos----    none per anesthesia, surgery orders pending case booked 01-09-2022           ?Lab results------none ?COVID test -----patient states asymptomatic no test needed ?Arrive at -------1130 am 01-12-2022 ?NPO after MN NO Solid Food.  Clear liquids from MN until---1030 am ?Med rec completed ?Medications to take morning of surgery -----none ?Diabetic medication -----n/a ?Patient instructed no nail polish to be worn day of surgery ?Patient instructed to bring photo id and insurance card day of surgery ?Patient aware to have Driver (ride )  daughter age 29 will drop pt off/ caregiver   raymond tavaras boyfriend   for 24 hours after surgery  ?Patient Special Instructions ----no vaping 24 hours before surgery ?Pre-Op special Istructions -----none ?Patient verbalized understanding of instructions that were given at this phone interview. ?Patient denies shortness of breath, chest pain, fever, cough at this phone interview.  ?

## 2022-01-11 NOTE — H&P (Signed)
Claudia Sharp is an 36 y.o. female [phone] with retained products of conception and episodes of heavy bleeding after a D&E at 18 weeks for Trisomy 62 at [facility]. ? ?01/09/22 [place]: 8.6cm anteverted uterus with 3.5cm endometrial stripe with heterogeneity, blood flow c/w retained POC. Normal ovaries. ? ? ?Menstrual History: ?No LMP recorded. ?  ? ?Past Medical History:  ?Diagnosis Date  ? ADHD (attention deficit hyperactivity disorder)   ? Anxiety   ? Seasonal Asthma   ? no current  inhaler use  ? ? ?Past Surgical History:  ?Procedure Laterality Date  ? BREAST ENHANCEMENT SURGERY    ? DILATION AND EVACUATION  11/20/2021  ? @ [facility]  ? LIPOSUCTION    ? ? ?Family History  ?Problem Relation Age of Onset  ? Asthma Mother   ? ? ?Social History:  reports that she has never smoked. She has never been exposed to tobacco smoke. She has never used smokeless tobacco. She reports that she does not currently use alcohol. She reports that she does not use drugs. ? ?Allergies: No Known Allergies ? ?No medications prior to admission.  ? ? ?Review of Systems  ?Constitutional:  Negative for fever.  ?HENT:  Negative for sore throat.   ?Respiratory:  Negative for shortness of breath.   ?Cardiovascular:  Negative for chest pain.  ?Gastrointestinal:  Negative for abdominal pain.  ?Genitourinary:  Positive for vaginal bleeding.  ?Musculoskeletal:  Negative for myalgias.  ?Skin:  Negative for rash.  ?Neurological:  Negative for headaches.  ?Psychiatric/Behavioral:  Negative for suicidal ideas.   ? ?Blood pressure 119/79, pulse 82, temperature 98.4 ?F (36.9 ?C), temperature source Oral, resp. rate 17, height 5\' 3"  (1.6 m), weight 71.7 kg, SpO2 95 %, not currently breastfeeding. ?Physical Exam ? ?Constitutional ?General Appearance: healthy-appearing, well-nourished, well-developed ? ?Head ?Head: normocephalic ? ?Lungs ?Respiratory Effort: no accessory muscle usage, no intercostal retractions ? ?Extremities ?Legs: normal ?Arms:  normal ? ?Skin ?Appearance: no obvious rashes, no obvious lesions ? ?Neurological System ?Impressions motor: no deficits, sensory: no deficits ? ?Psychiatric ?Orientation: to person, to time ?Mood and Affect: active and alert, normal mood, normal affect ? ?Results for orders placed or performed during the hospital encounter of 01/12/22 (from the past 24 hour(s))  ?CBC     Status: None  ? Collection Time: 01/12/22 11:49 AM  ?Result Value Ref Range  ? WBC 6.3 4.0 - 10.5 K/uL  ? RBC 3.97 3.87 - 5.11 MIL/uL  ? Hemoglobin 12.4 12.0 - 15.0 g/dL  ? HCT 37.2 36.0 - 46.0 %  ? MCV 93.7 80.0 - 100.0 fL  ? MCH 31.2 26.0 - 34.0 pg  ? MCHC 33.3 30.0 - 36.0 g/dL  ? RDW 12.6 11.5 - 15.5 %  ? Platelets 353 150 - 400 K/uL  ? nRBC 0.0 0.0 - 0.2 %  ? ? ?No results found. ? ?Assessment/Plan: ?35Y 03/14/22 with retained products of conception after D&E ?- Plan: suction dilation and curettage under ultrasound guidance ?- Informed consent obtained. Reviewed risk of infection, bleeding, uterine perforation, need for laparoscopy, failure to achieve desired results ?- Doxycycline 200mg  IV ordered ? ?[id number] ?01/12/2022, 1:14 PM ? ?

## 2022-01-12 ENCOUNTER — Ambulatory Visit (HOSPITAL_BASED_OUTPATIENT_CLINIC_OR_DEPARTMENT_OTHER): Payer: Medicaid Other | Admitting: Anesthesiology

## 2022-01-12 ENCOUNTER — Ambulatory Visit (HOSPITAL_COMMUNITY)
Admission: RE | Admit: 2022-01-12 | Discharge: 2022-01-12 | Disposition: A | Discharge status: Home / Self Care | Payer: Medicaid Other | Source: Ambulatory Visit | Attending: Obstetrics and Gynecology | Admitting: Obstetrics and Gynecology

## 2022-01-12 ENCOUNTER — Encounter (HOSPITAL_BASED_OUTPATIENT_CLINIC_OR_DEPARTMENT_OTHER): Payer: Self-pay | Admitting: Obstetrics and Gynecology

## 2022-01-12 ENCOUNTER — Ambulatory Visit (HOSPITAL_BASED_OUTPATIENT_CLINIC_OR_DEPARTMENT_OTHER)
Admission: RE | Admit: 2022-01-12 | Discharge: 2022-01-12 | Disposition: A | Discharge status: Home / Self Care | Payer: Medicaid Other | Attending: Obstetrics and Gynecology | Admitting: Obstetrics and Gynecology

## 2022-01-12 ENCOUNTER — Other Ambulatory Visit: Payer: Self-pay

## 2022-01-12 ENCOUNTER — Encounter (HOSPITAL_BASED_OUTPATIENT_CLINIC_OR_DEPARTMENT_OTHER)
Admission: RE | Disposition: A | Discharge status: Home / Self Care | Payer: Self-pay | Source: Home / Self Care | Attending: Obstetrics and Gynecology

## 2022-01-12 DIAGNOSIS — Z01818 Encounter for other preprocedural examination: Secondary | ICD-10-CM

## 2022-01-12 DIAGNOSIS — O074 Failed attempted termination of pregnancy without complication: Secondary | ICD-10-CM | POA: Insufficient documentation

## 2022-01-12 DIAGNOSIS — N711 Chronic inflammatory disease of uterus: Secondary | ICD-10-CM | POA: Insufficient documentation

## 2022-01-12 DIAGNOSIS — J45909 Unspecified asthma, uncomplicated: Secondary | ICD-10-CM | POA: Diagnosis not present

## 2022-01-12 DIAGNOSIS — F419 Anxiety disorder, unspecified: Secondary | ICD-10-CM | POA: Diagnosis not present

## 2022-01-12 HISTORY — PX: OPERATIVE ULTRASOUND: SHX5996

## 2022-01-12 HISTORY — DX: Attention-deficit hyperactivity disorder, unspecified type: F90.9

## 2022-01-12 HISTORY — PX: DILATION AND CURETTAGE OF UTERUS: SHX78

## 2022-01-12 LAB — CBC
HCT: 37.2 % (ref 36.0–46.0)
Hemoglobin: 12.4 g/dL (ref 12.0–15.0)
MCH: 31.2 pg (ref 26.0–34.0)
MCHC: 33.3 g/dL (ref 30.0–36.0)
MCV: 93.7 fL (ref 80.0–100.0)
Platelets: 353 10*3/uL (ref 150–400)
RBC: 3.97 MIL/uL (ref 3.87–5.11)
RDW: 12.6 % (ref 11.5–15.5)
WBC: 6.3 10*3/uL (ref 4.0–10.5)
nRBC: 0 % (ref 0.0–0.2)

## 2022-01-12 LAB — TYPE AND SCREEN
ABO/RH(D): O POS
Antibody Screen: NEGATIVE

## 2022-01-12 SURGERY — DILATION AND CURETTAGE
Anesthesia: General

## 2022-01-12 MED ORDER — ONDANSETRON HCL 4 MG/2ML IJ SOLN: 4.0000 mg | Freq: Once | INTRAMUSCULAR | Status: DC | PRN

## 2022-01-12 MED ORDER — ACETAMINOPHEN 500 MG PO TABS
ORAL_TABLET | ORAL | Status: AC
  Filled 2022-01-12: qty 2

## 2022-01-12 MED ORDER — METHYLERGONOVINE MALEATE 0.2 MG/ML IJ SOLN
INTRAMUSCULAR | Status: DC | PRN
  Administered 2022-01-12: .2 mg via INTRAMUSCULAR

## 2022-01-12 MED ORDER — FENTANYL CITRATE (PF) 100 MCG/2ML IJ SOLN
INTRAMUSCULAR | Status: AC
  Filled 2022-01-12: qty 2

## 2022-01-12 MED ORDER — LIDOCAINE HCL 1 % IJ SOLN
INTRAMUSCULAR | Status: DC | PRN
  Administered 2022-01-12: 10 mL

## 2022-01-12 MED ORDER — ONDANSETRON HCL 4 MG/2ML IJ SOLN
INTRAMUSCULAR | Status: AC
  Filled 2022-01-12: qty 2

## 2022-01-12 MED ORDER — PROPOFOL 10 MG/ML IV BOLUS
INTRAVENOUS | Status: AC
  Filled 2022-01-12: qty 20

## 2022-01-12 MED ORDER — OXYCODONE HCL 5 MG/5ML PO SOLN: 5.0000 mg | Freq: Once | ORAL | Status: DC | PRN

## 2022-01-12 MED ORDER — IBUPROFEN 200 MG PO TABS: 600.0000 mg | ORAL_TABLET | Freq: Four times a day (QID) | ORAL | Status: DC | PRN

## 2022-01-12 MED ORDER — LIDOCAINE 2% (20 MG/ML) 5 ML SYRINGE
INTRAMUSCULAR | Status: DC | PRN
Start: 2022-01-12 — End: 2022-01-12
  Administered 2022-01-12: 60 mg via INTRAVENOUS

## 2022-01-12 MED ORDER — DEXAMETHASONE SODIUM PHOSPHATE 10 MG/ML IJ SOLN
INTRAMUSCULAR | Status: DC | PRN
  Administered 2022-01-12: 10 mg via INTRAVENOUS

## 2022-01-12 MED ORDER — LIDOCAINE HCL (PF) 2 % IJ SOLN
INTRAMUSCULAR | Status: AC
  Filled 2022-01-12: qty 5

## 2022-01-12 MED ORDER — MIDAZOLAM HCL 2 MG/2ML IJ SOLN
INTRAMUSCULAR | Status: DC | PRN
Start: 2022-01-12 — End: 2022-01-12
  Administered 2022-01-12: 2 mg via INTRAVENOUS

## 2022-01-12 MED ORDER — 0.9 % SODIUM CHLORIDE (POUR BTL) OPTIME
TOPICAL | Status: DC | PRN
  Administered 2022-01-12: 500 mL

## 2022-01-12 MED ORDER — MIDAZOLAM HCL 2 MG/2ML IJ SOLN
INTRAMUSCULAR | Status: AC
  Filled 2022-01-12: qty 2

## 2022-01-12 MED ORDER — PROPOFOL 10 MG/ML IV BOLUS
INTRAVENOUS | Status: DC | PRN
  Administered 2022-01-12: 25 ug/kg/min via INTRAVENOUS
  Administered 2022-01-12: 180 mg via INTRAVENOUS

## 2022-01-12 MED ORDER — FENTANYL CITRATE (PF) 100 MCG/2ML IJ SOLN
25.0000 ug | INTRAMUSCULAR | Status: DC | PRN
  Administered 2022-01-12 (×2): 25 ug via INTRAVENOUS

## 2022-01-12 MED ORDER — DEXAMETHASONE SODIUM PHOSPHATE 10 MG/ML IJ SOLN
INTRAMUSCULAR | Status: AC
  Filled 2022-01-12: qty 1

## 2022-01-12 MED ORDER — LACTATED RINGERS IV SOLN: INTRAVENOUS | Status: DC

## 2022-01-12 MED ORDER — ONDANSETRON HCL 4 MG/2ML IJ SOLN
INTRAMUSCULAR | Status: DC | PRN
  Administered 2022-01-12: 4 mg via INTRAVENOUS

## 2022-01-12 MED ORDER — ACETAMINOPHEN 325 MG PO TABS: 650.0000 mg | ORAL_TABLET | Freq: Four times a day (QID) | ORAL | Status: DC | PRN

## 2022-01-12 MED ORDER — KETOROLAC TROMETHAMINE 15 MG/ML IJ SOLN
15.0000 mg | INTRAMUSCULAR | Status: AC
  Administered 2022-01-12: 15 mg via INTRAVENOUS

## 2022-01-12 MED ORDER — PROPOFOL 1000 MG/100ML IV EMUL
INTRAVENOUS | Status: AC
Start: 2022-01-12 — End: ?
  Filled 2022-01-12: qty 100

## 2022-01-12 MED ORDER — ACETAMINOPHEN 500 MG PO TABS
1000.0000 mg | ORAL_TABLET | ORAL | Status: AC
  Administered 2022-01-12: 1000 mg via ORAL

## 2022-01-12 MED ORDER — DOXYCYCLINE HYCLATE 100 MG IV SOLR
200.0000 mg | Freq: Once | INTRAVENOUS | Status: AC
  Administered 2022-01-12: 200 mg via INTRAVENOUS
  Filled 2022-01-12: qty 200

## 2022-01-12 MED ORDER — KETOROLAC TROMETHAMINE 30 MG/ML IJ SOLN
INTRAMUSCULAR | Status: AC
  Filled 2022-01-12: qty 1

## 2022-01-12 MED ORDER — FENTANYL CITRATE (PF) 100 MCG/2ML IJ SOLN
INTRAMUSCULAR | Status: DC | PRN
  Administered 2022-01-12 (×2): 50 ug via INTRAVENOUS

## 2022-01-12 MED ORDER — OXYCODONE HCL 5 MG PO TABS: 5.0000 mg | ORAL_TABLET | Freq: Once | ORAL | Status: DC | PRN

## 2022-01-12 SURGICAL SUPPLY — 23 items
CATH ROBINSON RED A/P 16FR (CATHETERS) ×1 IMPLANT
DILATOR CANAL MILEX (MISCELLANEOUS) IMPLANT
DRSG TELFA 3X8 NADH (GAUZE/BANDAGES/DRESSINGS) ×2 IMPLANT
FILTER UTR ASPR ASSEMBLY (MISCELLANEOUS) ×2 IMPLANT
GAUZE 4X4 16PLY ~~LOC~~+RFID DBL (SPONGE) ×2 IMPLANT
GLOVE SURG ENC MOIS LTX SZ6 (GLOVE) ×2 IMPLANT
GLOVE SURG UNDER POLY LF SZ6.5 (GLOVE) ×2 IMPLANT
GOWN STRL REUS W/ TWL LRG LVL3 (GOWN DISPOSABLE) ×2 IMPLANT
GOWN STRL REUS W/TWL LRG LVL3 (GOWN DISPOSABLE) ×2
HOSE CONNECTING 18IN BERKELEY (TUBING) ×2 IMPLANT
KIT BERKELEY 1ST TRI 3/8 NO TR (MISCELLANEOUS) ×2 IMPLANT
KIT BERKELEY 1ST TRIMESTER 3/8 (MISCELLANEOUS) ×2 IMPLANT
NS IRRIG 1000ML POUR BTL (IV SOLUTION) ×2 IMPLANT
PACK VAGINAL MINOR WOMEN LF (CUSTOM PROCEDURE TRAY) ×2 IMPLANT
PAD DRESSING TELFA 3X8 NADH (GAUZE/BANDAGES/DRESSINGS) ×1 IMPLANT
PAD OB MATERNITY 4.3X12.25 (PERSONAL CARE ITEMS) ×2 IMPLANT
SET BERKELEY SUCTION TUBING (SUCTIONS) ×2 IMPLANT
TOWEL OR 17X26 10 PK STRL BLUE (TOWEL DISPOSABLE) ×2 IMPLANT
UNDERPAD 30X36 HEAVY ABSORB (UNDERPADS AND DIAPERS) ×2 IMPLANT
VACURETTE 10 RIGID CVD (CANNULA) ×1 IMPLANT
VACURETTE 7MM CVD STRL WRAP (CANNULA) IMPLANT
VACURETTE 8 RIGID CVD (CANNULA) IMPLANT
VACURETTE 9 RIGID CVD (CANNULA) IMPLANT

## 2022-01-12 NOTE — Transfer of Care (Signed)
Immediate Anesthesia Transfer of Care NoteImmediate Anesthesia Transfer of Care Note ? ?Patient: Claudia Sharp ? ?Procedure(s) Performed: Procedure(s) (LRB): ?DILATATION AND CURETTAGE (N/A) ?OPERATIVE ULTRASOUND (N/A) ? ?Patient Location: PACU ? ?Anesthesia Type: General ? ?Level of Consciousness: awake, alert  and oriented ? ?Airway & Oxygen Therapy: Patient Spontanous Breathing ? ?Post-op Assessment: Report given to PACU RN and Post -op Vital signs reviewed and stable ? ?Post vital signs: Reviewed and stable ? ?Complications: No apparent anesthesia complications ?

## 2022-01-12 NOTE — Anesthesia Procedure Notes (Signed)
Procedure Name: LMA Insertion ?Date/Time: 01/12/2022 1:38 PM ?Performed by: Norva Pavlov, CRNA ?Pre-anesthesia Checklist: Patient identified, Emergency Drugs available, Suction available and Patient being monitored ?Patient Re-evaluated:Patient Re-evaluated prior to induction ?Oxygen Delivery Method: Circle system utilized ?Preoxygenation: Pre-oxygenation with 100% oxygen ?Induction Type: IV induction ?Ventilation: Mask ventilation without difficulty ?LMA: LMA inserted ?LMA Size: 4.0 ?Number of attempts: 1 ?Airway Equipment and Method: Bite block ?Placement Confirmation: positive ETCO2 ?Tube secured with: Tape ?Dental Injury: Teeth and Oropharynx as per pre-operative assessment  ? ? ? ? ?

## 2022-01-12 NOTE — Brief Op Note (Signed)
01/12/2022 ? ?2:10 PM ? ?PATIENT:  Claudia Sharp  36 y.o. female ? ?PRE-OPERATIVE DIAGNOSIS:  Retained products of conception ? ?POST-OPERATIVE DIAGNOSIS:  Retained products of conception ? ?PROCEDURE:  Procedure(s): ?DILATATION AND CURETTAGE (N/A) ?OPERATIVE ULTRASOUND (N/A) ? ?SURGEON:  Surgeon(s) and Role: ?   * Rowland Lathe, MD - Primary ? ?ANESTHESIA:   local and general ? ?EBL:  25mL ? ?BLOOD ADMINISTERED:none ? ?DRAINS: none  ? ?LOCAL MEDICATIONS USED:  LIDOCAINE  and Amount: 10 ml ? ?SPECIMEN:  Source of Specimen:  retained products of conceptions ? ?DISPOSITION OF SPECIMEN:  PATHOLOGY ? ?COUNTS:  YES ? ?TOURNIQUET:  * No tourniquets in log * ? ?DICTATION: .Note written in EPIC ? ?PLAN OF CARE: Discharge to home after PACU ? ?PATIENT DISPOSITION:  PACU - hemodynamically stable. ?  ?Delay start of Pharmacological VTE agent (>24hrs) due to surgical blood loss or risk of bleeding: not applicable ? ?

## 2022-01-12 NOTE — Anesthesia Preprocedure Evaluation (Signed)
Anesthesia Evaluation  ?Patient identified by MRN, date of birth, ID band ?Patient awake ? ? ? ?Reviewed: ?Allergy & Precautions, NPO status , Patient's Chart, lab work & pertinent test results ? ?Airway ?Mallampati: II ? ?TM Distance: >3 FB ?Neck ROM: Full ? ? ? Dental ?no notable dental hx. ?(+) Teeth Intact, Dental Advisory Given ?  ?Pulmonary ?asthma ,  ?  ?Pulmonary exam normal ?breath sounds clear to auscultation ? ? ? ? ? ? Cardiovascular ?negative cardio ROS ?Normal cardiovascular exam ?Rhythm:Regular Rate:Normal ? ? ?  ?Neuro/Psych ?PSYCHIATRIC DISORDERS Anxiety ADHDnegative neurological ROS ?   ? GI/Hepatic ?negative GI ROS, Neg liver ROS,   ?Endo/Other  ?negative endocrine ROS ? Renal/GU ?negative Renal ROS  ?negative genitourinary ?  ?Musculoskeletal ?negative musculoskeletal ROS ?(+)  ? Abdominal ?  ?Peds ? Hematology ?negative hematology ROS ?(+)   ?Anesthesia Other Findings ? ? Reproductive/Obstetrics ?Retained POC ? ?  ? ? ? ? ? ? ? ? ? ? ? ? ? ?  ?  ? ? ? ? ? ? ? ? ?Anesthesia Physical ?Anesthesia Plan ? ?ASA: 2 ? ?Anesthesia Plan: General  ? ?Post-op Pain Management:   ? ?Induction: Intravenous ? ?PONV Risk Score and Plan: 4 or greater and Treatment may vary due to age or medical condition, Propofol infusion and Ondansetron ? ?Airway Management Planned: LMA ? ?Additional Equipment:  ? ?Intra-op Plan:  ? ?Post-operative Plan: Extubation in OR ? ?Informed Consent: I have reviewed the patients History and Physical, chart, labs and discussed the procedure including the risks, benefits and alternatives for the proposed anesthesia with the patient or authorized representative who has indicated his/her understanding and acceptance.  ? ? ? ?Dental advisory given ? ?Plan Discussed with: CRNA and Anesthesiologist ? ?Anesthesia Plan Comments:   ? ? ? ? ? ? ?Anesthesia Quick Evaluation ? ?

## 2022-01-12 NOTE — Anesthesia Postprocedure Evaluation (Signed)
Anesthesia Post Note ? ?Patient: Claudia Sharp ? ?Procedure(s) Performed: DILATATION AND CURETTAGE ?OPERATIVE ULTRASOUND ? ?  ? ?Patient location during evaluation: PACU ?Anesthesia Type: General ?Level of consciousness: awake and alert and oriented ?Pain management: pain level controlled ?Vital Signs Assessment: post-procedure vital signs reviewed and stable ?Respiratory status: spontaneous breathing, nonlabored ventilation and respiratory function stable ?Cardiovascular status: blood pressure returned to baseline and stable ?Postop Assessment: no apparent nausea or vomiting ?Anesthetic complications: no ? ? ?No notable events documented. ? ?Last Vitals:  ?Vitals:  ? 01/12/22 1500 01/12/22 1530  ?BP: 116/85 (!) 124/94  ?Pulse:  63  ?Resp: 17   ?Temp:  36.4 ?C  ?SpO2:  100%  ?  ?Last Pain:  ?Vitals:  ? 01/12/22 1530  ?TempSrc:   ?PainSc: 1   ? ? ?  ?  ?  ?  ?  ?  ? ?Keron Koffman A. ? ? ? ? ?

## 2022-01-12 NOTE — Op Note (Signed)
01/12/2022 ?  ?2:10 PM ? ?OPERATIVE NOTE ?  ?PATIENT:  Claudia Sharp  36 y.o. female ?  ?PRE-OPERATIVE DIAGNOSIS:  Retained products of conception ?  ?POST-OPERATIVE DIAGNOSIS:  Retained products of conception ?  ?PROCEDURE:  Dilation and curettage under ultrasound guidance ?  ?SURGEON:  Surgeon(s) and Role: ?   * Charlett Nose, MD - Primary ?  ?ANESTHESIA:   local and general ?  ?EBL:  63mL ?  ?BLOOD ADMINISTERED:none ?  ?DRAINS: none  ?  ?LOCAL MEDICATIONS USED:  LIDOCAINE  and Amount: 10 ml ?  ?SPECIMEN:  Source of Specimen:  retained products of conceptions ?  ?DISPOSITION OF SPECIMEN:  PATHOLOGY ?  ?COUNTS:  YES ? ?  ?PLAN OF CARE: Discharge to home after PACU ?  ?PATIENT DISPOSITION:  PACU - hemodynamically stable. ?  ?COMPLICATIONS: none ? ?FINDINGS: 10 week size anteverted uterus with ultrasound demonstration 40mm endometrial stripe with heterogeneity and increased doppler flow ?  ?PROCEDURE DETAIL: Patient was appropriately consented and taken to the operating room where general anesthesia was administered. Doxycycline 200mg  was administered for infection prophylaxis. She was placed in the dorsal lithotomy position in stirrups. She was prepared and draped in normal sterile fashion. Time out was completed prior to starting the procedure. A speculum was inserted into the vagina and cervix was visualized.  The anterior lip of the cervix was grasped with a single-tooth tenaculum. 81mL of 1% lidocaine were injected at the 4 and 8 o'clock positions of the cervicovaginal junction to obtain a paracervical block.   ? ?The remaining portion of the procedure was performed under transabdominal ultrasound guidance. The cervix was a dilated with Pratt's dilators to accommodate a 10 mm suction curette.The suction curette was advanced to the uterine fundus. The suction was then started. The products of conception were evacuated with the curette rotating on outward movement for a total of four passes. A sharp gentle  curettage was then performed. Two final passes with the suction curette removed the remaining debris. The tenaculum was removed from the cervix and the puncture sites were noted to be hemostatic. The uterus was noted to be slightly atonic with some bleeding from cervical os. Methergine 0.2mg  IM was administered with good uterine response. Good hemostasis was noted at the cervical os. The patient tolerated the procedure well. Counts were correct times two. The patient was awakened and taken to the recovery room in stable condition. ?  ?M. Chena Chohan, 01/12/22 2:14 PM  ? ?  ?

## 2022-01-12 NOTE — Discharge Instructions (Signed)

## 2022-01-13 ENCOUNTER — Encounter (HOSPITAL_BASED_OUTPATIENT_CLINIC_OR_DEPARTMENT_OTHER): Payer: Self-pay | Admitting: Obstetrics and Gynecology

## 2022-01-13 LAB — SURGICAL PATHOLOGY

## 2022-01-28 ENCOUNTER — Telehealth: Payer: Self-pay | Admitting: Neurology

## 2022-01-28 DIAGNOSIS — G3184 Mild cognitive impairment, so stated: Secondary | ICD-10-CM

## 2022-01-28 DIAGNOSIS — R479 Unspecified speech disturbances: Secondary | ICD-10-CM | POA: Insufficient documentation

## 2022-01-28 NOTE — Telephone Encounter (Signed)
Patient left a voicemail on my phone stating that she is ready to schedule her MRI. I don't see one order yet.  ?

## 2022-01-28 NOTE — Telephone Encounter (Signed)
Orders Placed This Encounter  ?Procedures  ? MR BRAIN WO CONTRAST  ? ?To rule out obstructive abnormality such as MS, complains of memory loss stuttering of speech, word finding difficulties ?

## 2022-01-28 NOTE — Telephone Encounter (Signed)
I spoke to the patient. Reports her baby had Down Syndrome. At [redacted] weeks pregnant, she underwent an elective abortion. She would like to move forward with the MRI brain discussed at her last appointment. ?

## 2022-02-02 ENCOUNTER — Ambulatory Visit (HOSPITAL_COMMUNITY)
Admission: EM | Admit: 2022-02-02 | Discharge: 2022-02-02 | Disposition: A | Discharge status: Home / Self Care | Payer: Medicaid Other

## 2022-02-02 ENCOUNTER — Other Ambulatory Visit: Payer: Self-pay

## 2022-02-02 ENCOUNTER — Ambulatory Visit (INDEPENDENT_AMBULATORY_CARE_PROVIDER_SITE_OTHER): Payer: Medicaid Other

## 2022-02-02 ENCOUNTER — Encounter (HOSPITAL_COMMUNITY): Payer: Self-pay | Admitting: Emergency Medicine

## 2022-02-02 DIAGNOSIS — M25531 Pain in right wrist: Secondary | ICD-10-CM | POA: Diagnosis not present

## 2022-02-02 DIAGNOSIS — M79644 Pain in right finger(s): Secondary | ICD-10-CM | POA: Diagnosis not present

## 2022-02-02 MED ORDER — PREDNISONE 20 MG PO TABS: 40.0000 mg | ORAL_TABLET | Freq: Every day | ORAL | 0 refills | Status: DC

## 2022-02-02 MED ORDER — MELOXICAM 7.5 MG PO TABS: 7.5000 mg | ORAL_TABLET | Freq: Every day | ORAL | 0 refills | Status: DC

## 2022-02-02 NOTE — ED Triage Notes (Signed)
One month ago had an incident with car seat where right thumb was hyperextended.  Patient reports she is still having pain, pain in thumb radiating to wrist.   ?

## 2022-02-02 NOTE — ED Provider Notes (Signed)
?[facility] ? ? ? ?CSN: [id number] ?Arrival date & time: 02/02/22  1331 ? ? ?  ? ?History   ?Chief Complaint ?Chief Complaint  ?Patient presents with  ? Hand Pain  ? ? ?HPI ?Claudia Sharp is a 36 y.o. female.  ? ?Patient presents with pain, swelling to the right wrist and thumb for 1 month.  Symptoms began after thumb was hyperextended while moving around a car seat.  Endorses limited range of motion, unable to fully bend thumb.  Range of motion of wrist intact.  Experiencing numbness to the second through fifth right fingers.  Has attempted use of a brace which has been minimally effective.   ? ? ?Past Medical History:  ?Diagnosis Date  ? ADHD (attention deficit hyperactivity disorder)   ? Anxiety   ? Seasonal Asthma   ? no current  inhaler use  ? ? ?Patient Active Problem List  ? Diagnosis Date Noted  ? Speech disturbance 01/28/2022  ? Mild cognitive impairment 11/04/2021  ? Normal labor 05/07/2021  ? Spontaneous vaginal delivery 05/07/2021  ? ? ?Past Surgical History:  ?Procedure Laterality Date  ? BREAST ENHANCEMENT SURGERY    ? DILATION AND CURETTAGE OF UTERUS N/A 01/12/2022  ? Procedure: DILATATION AND CURETTAGE;  Surgeon: Charlett Nose, MD;  Location: [facility];  Service: Gynecology;  Laterality: N/A;  ? DILATION AND EVACUATION  11/20/2021  ? @ [facility]  ? LIPOSUCTION    ? OPERATIVE ULTRASOUND N/A 01/12/2022  ? Procedure: OPERATIVE ULTRASOUND;  Surgeon: Charlett Nose, MD;  Location: [facility];  Service: Gynecology;  Laterality: N/A;  ? ? ?OB History   ? ? Gravida  ?3  ? Para  ?2  ? Term  ?2  ? Preterm  ?   ? AB  ?   ? Living  ?2  ?  ? ? SAB  ?   ? IAB  ?   ? Ectopic  ?   ? Multiple  ?0  ? Live Births  ?2  ?   ?  ?  ? ? ? ?Home Medications   ? ?Prior to Admission medications   ?Medication Sig Start Date End Date Taking? Authorizing Provider  ?Prenatal Vit-Fe Fumarate-FA (PRENATAL MULTIVITAMIN) TABS tablet Take 1 tablet by mouth daily at 12 noon.   Yes  [provider]  ?acetaminophen (TYLENOL) 325 MG tablet Take 2 tablets (650 mg total) by mouth every 6 (six) hours as needed (pain). 01/12/22   Charlett Nose, MD  ?amphetamine-dextroamphetamine (ADDERALL XR) 10 MG 24 hr capsule Take 10 mg by mouth every morning. 12/29/21   [provider]  ?ibuprofen (ADVIL) 200 MG tablet Take 3 tablets (600 mg total) by mouth every 6 (six) hours as needed for cramping. 01/12/22   Charlett Nose, MD  ?JUNEL FE 1/20 1-20 MG-MCG tablet Take 1 tablet by mouth daily. 12/22/21   [provider]  ? ? ?Family History ?Family History  ?Problem Relation Age of Onset  ? Asthma Mother   ? ? ?Social History ?Social History  ? ?Tobacco Use  ? Smoking status: Never  ?  Passive exposure: Never  ? Smokeless tobacco: Never  ?Vaping Use  ? Vaping Use: Every day  ? Substances: Nicotine  ?Substance Use Topics  ? Alcohol use: Not Currently  ?  Comment: very rare  ? Drug use: Never  ? ? ? ?Allergies   ?Patient has no known allergies. ? ? ?Review of Systems ?Review of  Systems ? ? ?Physical Exam ?Triage Vital Signs ?ED Triage Vitals  ?Enc Vitals Group  ?   BP 02/02/22 1435 122/85  ?   Pulse Rate 02/02/22 1435 81  ?   Resp 02/02/22 1435 18  ?   Temp 02/02/22 1435 98.4 ?F (36.9 ?C)  ?   Temp Source 02/02/22 1435 Oral  ?   SpO2 02/02/22 1435 98 %  ?   Weight --   ?   Height --   ?   Head Circumference --   ?   Peak Flow --   ?   Pain Score 02/02/22 1431 5  ?   Pain Loc --   ?   Pain Edu? --   ?   Excl. in GC? --   ? ?No data found. ? ?Updated Vital Signs ?BP 122/85 (BP Location: Right Arm)   Pulse 81   Temp 98.4 ?F (36.9 ?C) (Oral)   Resp 18   LMP 01/19/2022 (Approximate)   SpO2 98%  ? ?Visual Acuity ?Right Eye Distance:   ?Left Eye Distance:   ?Bilateral Distance:   ? ?Right Eye Near:   ?Left Eye Near:    ?Bilateral Near:    ? ?Physical Exam ?Constitutional:   ?   Appearance: Normal appearance.  ?Eyes:  ?   Extraocular Movements: Extraocular movements intact.   ?Pulmonary:  ?   Effort: Pulmonary effort is normal.  ?Musculoskeletal:  ?   Comments: Numbness present along the radial aspect of the right wrist, no tenderness throughout the thumb, no ecchymosis, deformity or swelling noted at the wrist or the thumb, range of motion intact, negative Tinel's and Phalen's  ?Skin: ?   General: Skin is warm and dry.  ?Neurological:  ?   Mental Status: She is alert and oriented to person, place, and time. Mental status is at baseline.  ?Psychiatric:     ?   Mood and Affect: Mood normal.     ?   Behavior: Behavior normal.  ? ? ? ?UC Treatments / Results  ?Labs ?(all labs ordered are listed, but only abnormal results are displayed) ?Labs Reviewed - No data to display ? ?EKG ? ? ?Radiology ?DG Finger Thumb Right ? ?Result Date: 02/02/2022 ?CLINICAL DATA:  Hyperextension injury 1 month ago.  Persistent pain. EXAM: RIGHT THUMB 2+V COMPARISON:  None. FINDINGS: No acute fracture or dislocation. No definite soft tissue swelling. Joint spaces maintained. IMPRESSION: No acute osseous abnormality. Electronically Signed   By: Jeronimo Greaves M.D.   On: 02/02/2022 15:02   ? ?Procedures ?Procedures (including critical care time) ? ?Medications Ordered in UC ?Medications - No data to display ? ?Initial Impression / Assessment and Plan / UC Course  ?I have reviewed the triage vital signs and the nursing notes. ? ?Pertinent labs & imaging results that were available during my care of the patient were reviewed by me and considered in my medical decision making (see chart for details). ? ?Right thumb pain ?Acute right wrist pain ? ?X-ray negative, discussed findings with patient, prescribed prednisone 40 mg burst for 5 days then meloxicam as daily for 5 days, recommended RICE and heat over the affected area and continue use of brace for support, recommended follow-up with orthopedics if symptoms continue to persist past use of a medications, walking referral given ?Final Clinical Impressions(s) / UC  Diagnoses  ? ?Final diagnoses:  ?None  ? ?Discharge Instructions   ?None ?  ? ?ED Prescriptions   ?None ?  ? ?  PDMP not reviewed this encounter. ?  ?Valinda HoarWhite, Brielynn Sekula R, NP ?02/02/22 1529 ? ?

## 2022-02-02 NOTE — Discharge Instructions (Addendum)
X-ray of your right thumb was negative, was able to visualize the side of your right wrist where pain is also experienced, no signs of fracture to your wrist ? ?Symptomology may be a result of irritation to the ligaments, we will start you on medication to reduce irritation and inflammation ? ?Beginning tomorrow take prednisone every morning with food for 5 days ? ?Once prednisone is complete take meloxicam for 5 days then as needed ? ?May use of ice or heat over the affected area in 15-minute intervals ? ?May continue to wear wrist brace ? ?Follow-up with orthopedics if symptoms continue to persist past use of medication ?

## 2022-02-10 ENCOUNTER — Telehealth: Payer: Self-pay | Admitting: Neurology

## 2022-02-10 NOTE — Telephone Encounter (Signed)
Medicaid healthy blue Claudia Sharp: [id number] exp. 02/10/22-04/10/22 sent to [facility], they will reach out to the patient to schedule. ?

## 2022-02-10 NOTE — Telephone Encounter (Signed)
Medicaid [place] auth: [id number] exp. 02/10/22-04/10/22 sent to [facility], they will reach out to the patient to schedule. ?

## 2022-02-15 ENCOUNTER — Inpatient Hospital Stay: Admission: RE | Admit: 2022-02-15 | Payer: Medicaid Other | Source: Ambulatory Visit

## 2022-05-20 ENCOUNTER — Telehealth: Payer: Self-pay | Admitting: Neurology

## 2022-05-20 NOTE — Telephone Encounter (Signed)
Pt is calling and said she wants to get brain scan don not that she is not pregnant. Pt is requesting a call from the nurse

## 2022-05-20 NOTE — Telephone Encounter (Signed)
I will redo the authorization and send it to GI

## 2022-05-21 NOTE — Telephone Encounter (Signed)
I called the pt and update her on this information. If pt calls back telephone room staff can relay.

## 2022-05-25 ENCOUNTER — Telehealth: Payer: Self-pay | Admitting: Neurology

## 2022-05-25 NOTE — Telephone Encounter (Signed)
Patient asked to schedule her MRI that was ordered in April, insurance inactive.

## 2022-07-23 NOTE — Telephone Encounter (Signed)
The patient came into the office today with her new insurance information. [employer] [place]: [id number] exp. 07/23/22-09/20/22 sent to [facility]

## 2022-08-12 ENCOUNTER — Ambulatory Visit (HOSPITAL_COMMUNITY)
Admission: RE | Admit: 2022-08-12 | Discharge: 2022-08-12 | Disposition: A | Discharge status: Home / Self Care | Payer: Medicaid Other | Source: Ambulatory Visit | Attending: Neurology | Admitting: Neurology

## 2022-08-12 DIAGNOSIS — R479 Unspecified speech disturbances: Secondary | ICD-10-CM | POA: Insufficient documentation

## 2022-08-12 DIAGNOSIS — G3184 Mild cognitive impairment, so stated: Secondary | ICD-10-CM | POA: Diagnosis not present

## 2022-08-12 DIAGNOSIS — Z419 Encounter for procedure for purposes other than remedying health state, unspecified: Secondary | ICD-10-CM | POA: Diagnosis not present

## 2022-08-20 IMAGING — DX DG FINGER THUMB 2+V*R*
3 series · 3 of 3 positions shown · non-contrast
Comparison: None.

CLINICAL DATA: Hyperextension injury 1 month ago.  Persistent pain.

EXAM:
RIGHT THUMB 2+V

[finger ap]
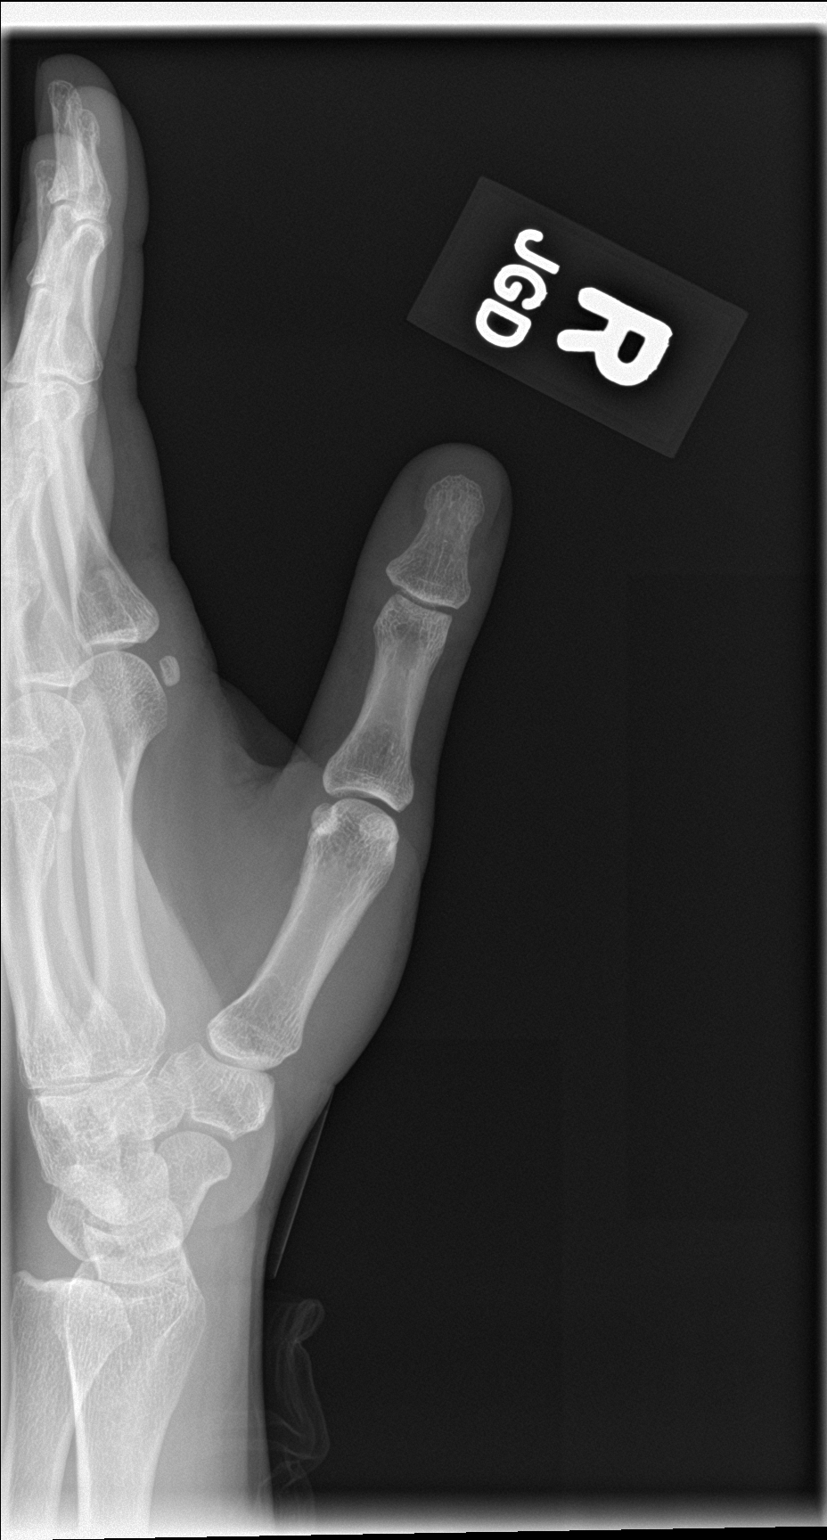

[finger obl]
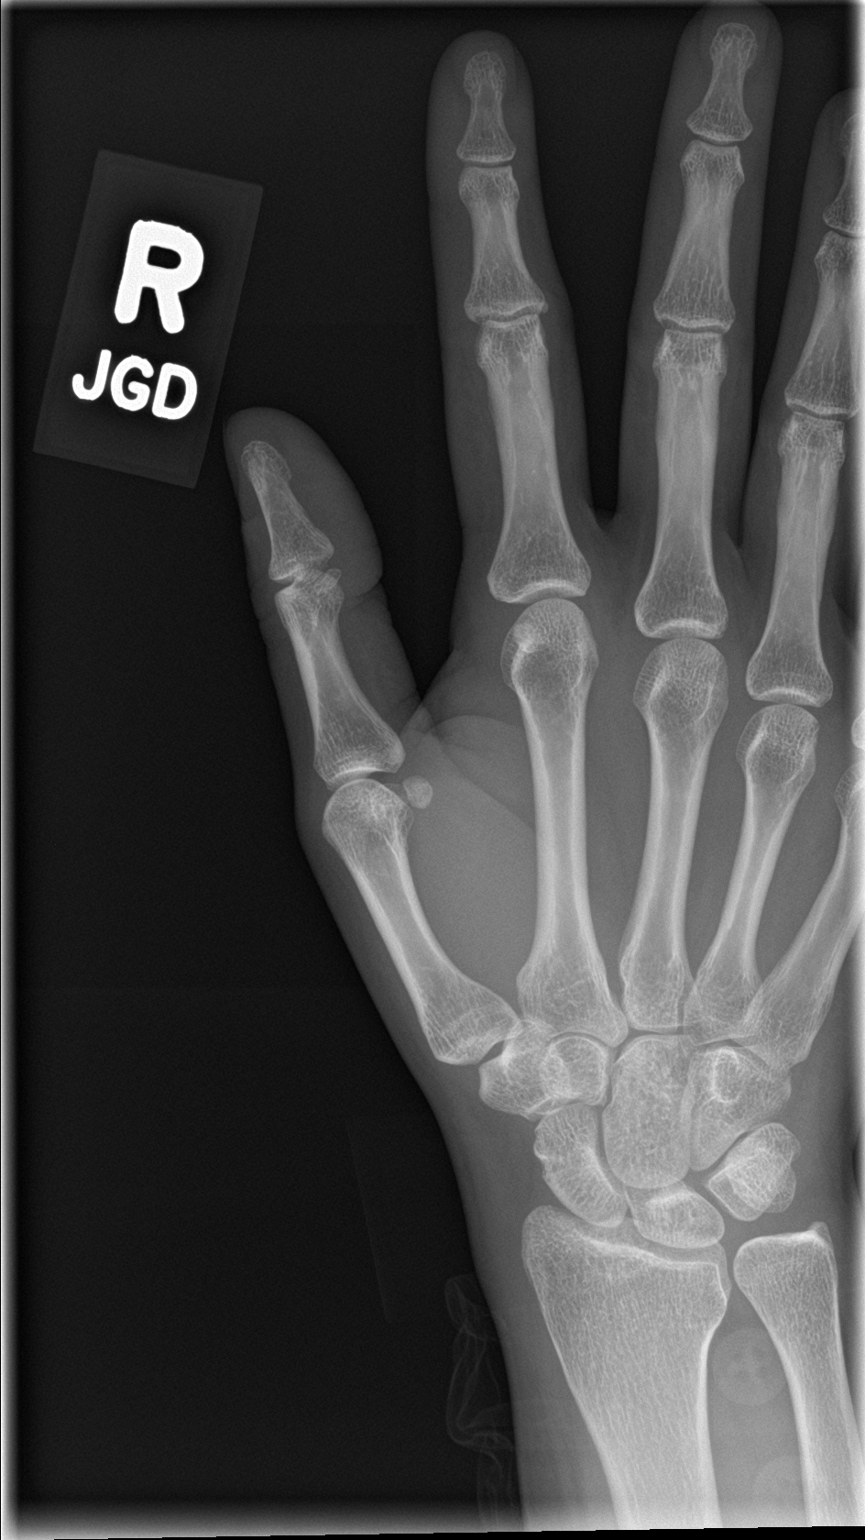

[finger lat]
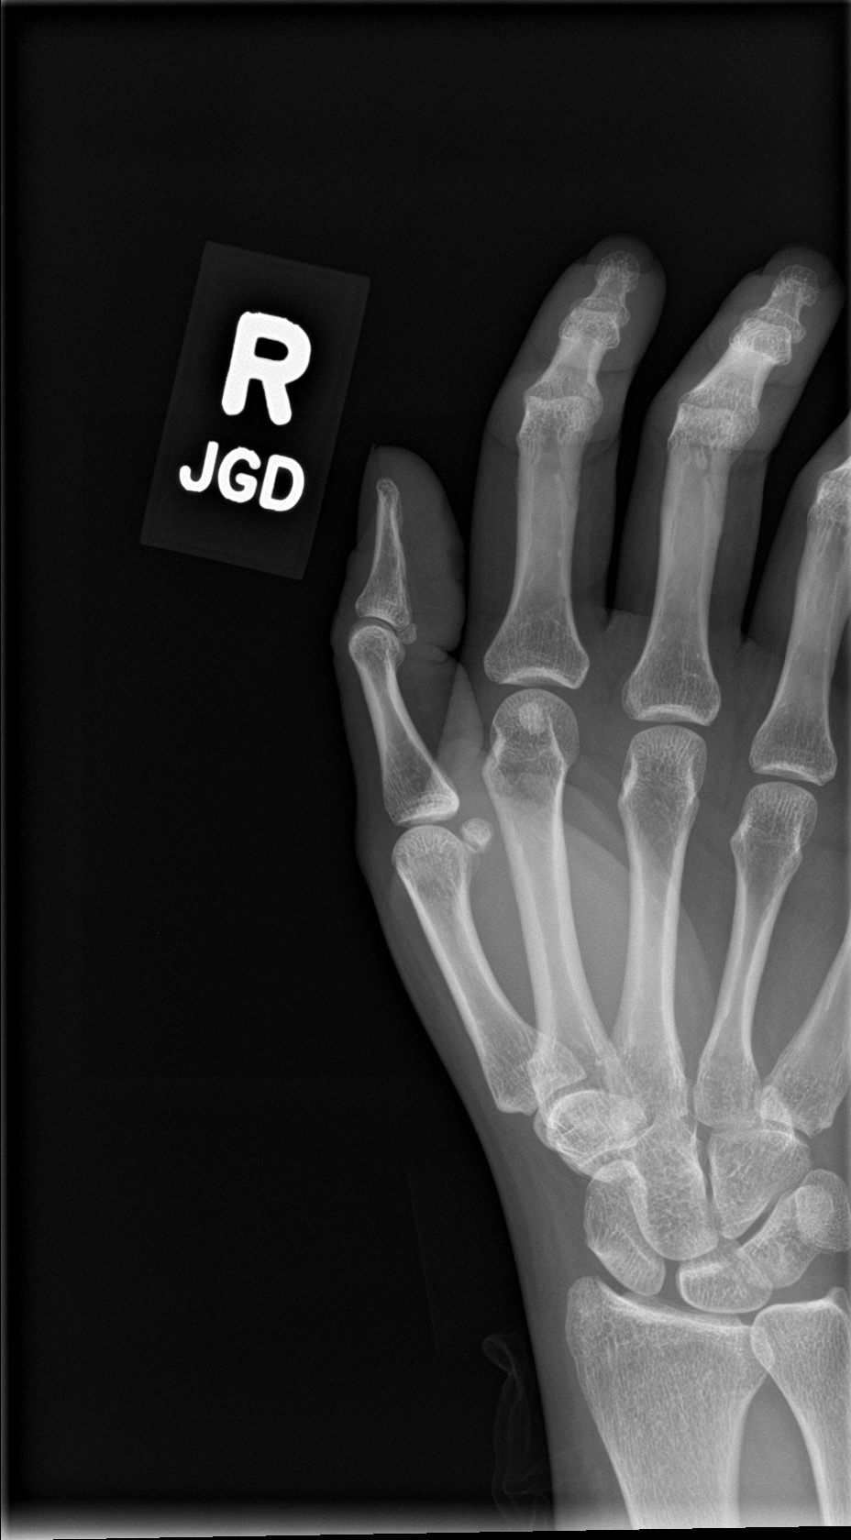

[3 of 3 positions shown; findings below may reference images not displayed]

FINDINGS: No acute fracture or dislocation. No definite soft tissue swelling.
Joint spaces maintained.
IMPRESSION: No acute osseous abnormality.

## 2022-09-11 DIAGNOSIS — F902 Attention-deficit hyperactivity disorder, combined type: Secondary | ICD-10-CM | POA: Diagnosis not present

## 2022-12-18 ENCOUNTER — Telehealth: Payer: Self-pay

## 2022-12-18 NOTE — Telephone Encounter (Signed)
Mychart msg sent

## 2023-09-20 ENCOUNTER — Encounter: Payer: Self-pay | Admitting: Physician Assistant

## 2023-09-20 ENCOUNTER — Ambulatory Visit (INDEPENDENT_AMBULATORY_CARE_PROVIDER_SITE_OTHER): Payer: Medicaid Other | Admitting: Physician Assistant

## 2023-09-20 VITALS — BP 118/85 | HR 91 | Temp 97.7°F | Ht 63.0 in | Wt 157.4 lb

## 2023-09-20 DIAGNOSIS — J4520 Mild intermittent asthma, uncomplicated: Secondary | ICD-10-CM

## 2023-09-20 DIAGNOSIS — F909 Attention-deficit hyperactivity disorder, unspecified type: Secondary | ICD-10-CM

## 2023-09-20 DIAGNOSIS — F419 Anxiety disorder, unspecified: Secondary | ICD-10-CM

## 2023-09-20 DIAGNOSIS — R350 Frequency of micturition: Secondary | ICD-10-CM

## 2023-09-20 DIAGNOSIS — L308 Other specified dermatitis: Secondary | ICD-10-CM

## 2023-09-20 DIAGNOSIS — R519 Headache, unspecified: Secondary | ICD-10-CM

## 2023-09-20 DIAGNOSIS — R42 Dizziness and giddiness: Secondary | ICD-10-CM | POA: Diagnosis not present

## 2023-09-20 DIAGNOSIS — R339 Retention of urine, unspecified: Secondary | ICD-10-CM

## 2023-09-20 LAB — URINALYSIS, ROUTINE W REFLEX MICROSCOPIC
Bilirubin Urine: NEGATIVE
Hgb urine dipstick: NEGATIVE
Ketones, ur: NEGATIVE
Leukocytes,Ua: NEGATIVE
Nitrite: NEGATIVE
RBC / HPF: NONE SEEN (ref 0–?)
Specific Gravity, Urine: <=1.005 — AB (ref 1.000–1.030)
Total Protein, Urine: NEGATIVE
Urine Glucose: NEGATIVE
Urobilinogen, UA: 0.2 (ref 0.0–1.0)
pH: 6 (ref 5.0–8.0)

## 2023-09-20 MED ORDER — ALBUTEROL SULFATE HFA 108 (90 BASE) MCG/ACT IN AERS: 2.0000 | INHALATION_SPRAY | Freq: Four times a day (QID) | RESPIRATORY_TRACT | 2 refills | Status: DC | PRN

## 2023-09-20 MED ORDER — TRIAMCINOLONE ACETONIDE 0.1 % EX CREA: 1.0000 | TOPICAL_CREAM | Freq: Two times a day (BID) | CUTANEOUS | 1 refills | Status: DC

## 2023-09-20 MED ORDER — TRIAMCINOLONE ACETONIDE 0.1 % EX CREA: 1.0000 | TOPICAL_CREAM | Freq: Two times a day (BID) | CUTANEOUS | 1 refills | Status: AC

## 2023-09-20 NOTE — Progress Notes (Unsigned)
Patient ID: Claudia Sharp, female    DOB: April 04, 1986, 37 y.o.   MRN: [id number]   Assessment & Plan:  Frequent headaches -     CBC with Differential/Platelet -     Comprehensive metabolic panel -     TSH -     Hemoglobin A1c -     Ambulatory referral to Neurology  Dizziness  Attention deficit hyperactivity disorder (ADHD), unspecified ADHD type  Anxiety  Mild intermittent asthma without complication -     Albuterol Sulfate HFA; Inhale 2 puffs into the lungs every 6 (six) hours as needed for wheezing or shortness of breath.  Dispense: 18 each; Refill: 2  Other eczema -     Triamcinolone Acetonide; Apply 1 Application topically 2 (two) times daily. Apply thin layer to affected areas no longer than 2 weeks consistently then sparingly.  Dispense: 60 g; Refill: 1  Urinary frequency -     CBC with Differential/Platelet -     Comprehensive metabolic panel -     TSH -     Hemoglobin A1c -     Urine Culture -     Urinalysis, Routine w reflex microscopic -     Ambulatory referral to Physical Therapy  Incomplete bladder emptying -     Ambulatory referral to Physical Therapy   Assessment and Plan    Urinary Frequency and Urgency Longstanding issue with increased frequency and urgency, now with incontinence. Previous urology evaluation revealed incomplete bladder emptying. -Collect urine sample for analysis. -Consider referral to urology or urogynecology. -Consider pelvic floor physical therapy.  Headaches and Dizziness New onset of headaches and dizziness, with a sensation of eye pressure. MRI of the brain performed in November of the previous year. -Check blood work for potential underlying causes. -Consider referral to neurology for further evaluation.  Eczema Itchy rashes on the neck, likely eczema. -Prescribe triamcinolone cream, to be used twice daily for two weeks, then sparingly as needed.  Asthma History of asthma, currently without an inhaler. -Prescribe  Proair rescue inhaler.  General Health Maintenance -Check blood work. -Consider eye exam due to headaches and sensation of eye pressure. -Schedule physical exam.           Return if symptoms worsen or fail to improve.    Subjective:    Chief Complaint  Patient presents with   Transitions Of Care    Pt here to discuss a couple of issues, pt c/o of bladder issues, pap done at [facility] in 2024   Anxiety   Asthma    HPI Patient is in today for new patient establishment. 36 yo daughter in [place], [place]. 2 yo daughter and baby's father live at home with her.   She follows with [facility].   Discussed the use of AI scribe software for clinical note transcription with the patient, who gave verbal consent to proceed.  History of Present Illness   Claudia Sharp, a 36 year old with a history of asthma, presents with worsening bladder issues, new onset headaches, and dizziness. The bladder issues have become so severe that they are impacting her daily life, causing her to urinate every half an hour and even experience leakage. This issue has been ongoing for several years, with a urologist in [place] noting that she was not fully emptying her bladder. However, she has not sought medical attention for this issue in approximately seven years.  In addition to her bladder issues, Claudia Sharp has been experiencing splitting headaches, which she describes  as feeling like pressure behind her eyes. These headaches are a new symptom for her, and she is unsure of their cause. She also reports experiencing dizzy spells. Claudia Sharp has a history of ADHD and has noticed an increase in clumsiness, including a new onset of stuttering and frequently running into stationary objects.  Claudia Sharp also mentions a rash that flares up intermittently, causing intense itching. She has been moisturizing twice daily, but the rash persists. She also reports pain during sexual intercourse and has a history  of vaginal births. She has not had any recent changes in her medications, which include Adderall, Cymbalta, Nexplanon, and Trazodone for sleep.       Past Medical History:  Diagnosis Date   ADHD (attention deficit hyperactivity disorder)    Anxiety    Seasonal Asthma    no current  inhaler use    Past Surgical History:  Procedure Laterality Date   BREAST ENHANCEMENT SURGERY     DILATION AND CURETTAGE OF UTERUS N/A 01/12/2022   Procedure: DILATATION AND CURETTAGE;  Surgeon: Charlett Nose, MD;  Location: [facility];  Service: Gynecology;  Laterality: N/A;   DILATION AND EVACUATION  11/20/2021   @ [facility]   LIPOSUCTION     OPERATIVE ULTRASOUND N/A 01/12/2022   Procedure: OPERATIVE ULTRASOUND;  Surgeon: Charlett Nose, MD;  Location: [facility];  Service: Gynecology;  Laterality: N/A;    Family History  Problem Relation Age of Onset   Asthma Mother     Social History   Tobacco Use   Smoking status: Never    Passive exposure: Never   Smokeless tobacco: Never  Vaping Use   Vaping status: Every Day   Substances: Nicotine  Substance Use Topics   Alcohol use: Not Currently    Comment: very rare   Drug use: Never     No Known Allergies  Review of Systems NEGATIVE UNLESS OTHERWISE INDICATED IN HPI      Objective:     BP 118/85   Pulse 91   Temp 97.7 F (36.5 C)   Ht 5\' 3"  (1.6 m)   Wt 157 lb 6.4 oz (71.4 kg)   LMP 08/27/2023   SpO2 100%   BMI 27.88 kg/m   Wt Readings from Last 3 Encounters:  09/20/23 157 lb 6.4 oz (71.4 kg)  01/12/22 158 lb 1.6 oz (71.7 kg)  11/04/21 166 lb (75.3 kg)    BP Readings from Last 3 Encounters:  09/20/23 118/85  02/02/22 122/85  01/12/22 (!) 124/94     Physical Exam Vitals and nursing note reviewed.  Constitutional:      Appearance: Normal appearance. She is normal weight. She is not toxic-appearing.  HENT:     Head: Normocephalic and atraumatic.     Right Ear: External ear  normal.     Left Ear: External ear normal.     Nose: Nose normal.     Mouth/Throat:     Mouth: Mucous membranes are moist.  Eyes:     Extraocular Movements: Extraocular movements intact.     Conjunctiva/sclera: Conjunctivae normal.     Pupils: Pupils are equal, round, and reactive to light.  Cardiovascular:     Rate and Rhythm: Normal rate and regular rhythm.     Pulses: Normal pulses.     Heart sounds: Normal heart sounds.  Pulmonary:     Effort: Pulmonary effort is normal.     Breath sounds: Normal breath sounds.  Musculoskeletal:  General: Normal range of motion.     Cervical back: Normal range of motion and neck supple.  Skin:    General: Skin is warm and dry.     Findings: Rash (dry patches neck) present.  Neurological:     General: No focal deficit present.     Mental Status: She is alert and oriented to person, place, and time.  Psychiatric:        Mood and Affect: Mood normal.        Behavior: Behavior normal.        Thought Content: Thought content normal.        Judgment: Judgment normal.          Chanteria Haggard M Azyriah Nevins, PA-C

## 2023-09-20 NOTE — Patient Instructions (Signed)
VISIT SUMMARY:  Today, we addressed several of your health concerns, including your bladder issues, new headaches and dizziness, itchy rashes, and asthma. We discussed the next steps for each of these issues and made plans for further evaluations and treatments.  YOUR PLAN:  -URINARY FREQUENCY AND URGENCY: You have been experiencing frequent and urgent urination, along with incontinence. This means you need to urinate often and sometimes cannot control it. We will collect a urine sample for analysis and consider referring you to a specialist in urology or urogynecology. Pelvic floor physical therapy may also be beneficial.  -HEADACHES AND DIZZINESS: You have been having new headaches and dizziness, which feel like pressure behind your eyes. We will check your blood work to look for any underlying causes and may refer you to a neurologist for further evaluation.  -ECZEMA: You have itchy rashes on your neck, likely due to eczema, which is a condition that makes your skin red and itchy. We have prescribed triamcinolone cream to be used twice daily for two weeks, then as needed.  -ASTHMA: You have a history of asthma, which is a condition that makes it hard to breathe. We have prescribed a Proair rescue inhaler for you to use when you have asthma symptoms.  -GENERAL HEALTH MAINTENANCE: We will check your blood work and consider an eye exam due to your headaches and eye pressure. Additionally, we will schedule a physical exam to review your overall health.  INSTRUCTIONS:  Please follow up with the urine sample collection and use the prescribed triamcinolone cream as directed. Schedule an appointment for blood work and an eye exam. We will also arrange a physical exam for you. If your symptoms worsen or you have any concerns, please contact our office.

## 2023-09-21 LAB — URINE CULTURE
MICRO NUMBER:: 15825748
Result:: NO GROWTH
SPECIMEN QUALITY:: ADEQUATE

## 2023-09-27 ENCOUNTER — Other Ambulatory Visit: Payer: Self-pay

## 2023-09-27 ENCOUNTER — Other Ambulatory Visit (INDEPENDENT_AMBULATORY_CARE_PROVIDER_SITE_OTHER): Payer: Medicaid Other

## 2023-09-27 ENCOUNTER — Other Ambulatory Visit: Payer: Self-pay | Admitting: Physician Assistant

## 2023-09-27 DIAGNOSIS — R519 Headache, unspecified: Secondary | ICD-10-CM

## 2023-09-27 DIAGNOSIS — R350 Frequency of micturition: Secondary | ICD-10-CM

## 2023-09-27 DIAGNOSIS — R42 Dizziness and giddiness: Secondary | ICD-10-CM

## 2023-09-28 LAB — CBC WITH DIFFERENTIAL/PLATELET
Basophils Absolute: 0 10*3/uL (ref 0.0–0.1)
Basophils Relative: 0.5 % (ref 0.0–3.0)
Eosinophils Absolute: 0.2 10*3/uL (ref 0.0–0.7)
Eosinophils Relative: 2 % (ref 0.0–5.0)
HCT: 38.9 % (ref 36.0–46.0)
Hemoglobin: 12.8 g/dL (ref 12.0–15.0)
Lymphocytes Relative: 38 % (ref 12.0–46.0)
Lymphs Abs: 3 10*3/uL (ref 0.7–4.0)
MCHC: 33 g/dL (ref 30.0–36.0)
MCV: 93.3 fL (ref 78.0–100.0)
Monocytes Absolute: 0.6 10*3/uL (ref 0.1–1.0)
Monocytes Relative: 7.4 % (ref 3.0–12.0)
Neutro Abs: 4.1 10*3/uL (ref 1.4–7.7)
Neutrophils Relative %: 52.1 % (ref 43.0–77.0)
Platelets: 351 10*3/uL (ref 150.0–400.0)
RBC: 4.17 Mil/uL (ref 3.87–5.11)
RDW: 13.1 % (ref 11.5–15.5)
WBC: 7.9 10*3/uL (ref 4.0–10.5)

## 2023-09-28 LAB — COMPREHENSIVE METABOLIC PANEL
ALT: 13 U/L (ref 0–35)
AST: 16 U/L (ref 0–37)
Albumin: 4.1 g/dL (ref 3.5–5.2)
Alkaline Phosphatase: 50 U/L (ref 39–117)
BUN: 11 mg/dL (ref 6–23)
CO2: 25 meq/L (ref 19–32)
Calcium: 8.8 mg/dL (ref 8.4–10.5)
Chloride: 106 meq/L (ref 96–112)
Creatinine, Ser: 0.5 mg/dL (ref 0.40–1.20)
GFR: 119.92 mL/min (ref >60.00)
Glucose, Bld: 92 mg/dL (ref 70–99)
Potassium: 4.1 meq/L (ref 3.5–5.1)
Sodium: 139 meq/L (ref 135–145)
Total Bilirubin: 0.2 mg/dL (ref 0.2–1.2)
Total Protein: 6.7 g/dL (ref 6.0–8.3)

## 2023-09-28 LAB — HEMOGLOBIN A1C: Hgb A1c MFr Bld: 5.5 % (ref 4.6–6.5)

## 2023-09-28 LAB — TSH: TSH: 1.11 u[IU]/mL (ref 0.35–5.50)

## 2023-10-20 ENCOUNTER — Ambulatory Visit: Payer: Medicaid Other | Attending: Physician Assistant | Admitting: Physical Therapy

## 2023-10-20 DIAGNOSIS — R350 Frequency of micturition: Secondary | ICD-10-CM | POA: Diagnosis not present

## 2023-10-20 DIAGNOSIS — R339 Retention of urine, unspecified: Secondary | ICD-10-CM | POA: Diagnosis not present

## 2023-10-20 DIAGNOSIS — R293 Abnormal posture: Secondary | ICD-10-CM | POA: Insufficient documentation

## 2023-10-20 DIAGNOSIS — R279 Unspecified lack of coordination: Secondary | ICD-10-CM | POA: Insufficient documentation

## 2023-10-20 DIAGNOSIS — M6281 Muscle weakness (generalized): Secondary | ICD-10-CM | POA: Diagnosis present

## 2023-10-20 NOTE — Therapy (Signed)
 OUTPATIENT PHYSICAL THERAPY FEMALE PELVIC EVALUATION   Patient Name: Claudia Sharp MRN: [id number] DOB:1986/01/19, 38 y.o., female Today's Date: 10/20/2023  END OF SESSION:  PT End of Session - 10/20/23 1235     Visit Number 1    Date for PT Re-Evaluation 02/17/24    Authorization Type [facility]    PT Start Time 1231    PT Stop Time 1305    PT Time Calculation (min) 34 min    Activity Tolerance Patient tolerated treatment well    Behavior During Therapy WFL for tasks assessed/performed             Past Medical History:  Diagnosis Date   ADHD (attention deficit hyperactivity disorder)    Anxiety    Seasonal Asthma    no current  inhaler use   Past Surgical History:  Procedure Laterality Date   BREAST ENHANCEMENT SURGERY     DILATION AND CURETTAGE OF UTERUS N/A 01/12/2022   Procedure: DILATATION AND CURETTAGE;  Surgeon: Diedre Rosaline BRAVO, MD;  Location: [facility] [place];  Service: Gynecology;  Laterality: N/A;   DILATION AND EVACUATION  11/20/2021   @ [facility]   LIPOSUCTION     OPERATIVE ULTRASOUND N/A 01/12/2022   Procedure: OPERATIVE ULTRASOUND;  Surgeon: Diedre Rosaline BRAVO, MD;  Location: [facility] [place];  Service: Gynecology;  Laterality: N/A;   Patient Active Problem List   Diagnosis Date Noted   Attention deficit hyperactivity disorder (ADHD) 09/20/2023   Anxiety 09/20/2023   Speech disturbance 01/28/2022   Mild cognitive impairment 11/04/2021   Normal labor 05/07/2021   Spontaneous vaginal delivery 05/07/2021    PCP: Allwardt, Mardy HERO, PA-C   REFERRING PROVIDER: Allwardt, Mardy HERO, PA-C   REFERRING DIAG: R35.0 (ICD-10-CM) - Urinary frequency R33.9 (ICD-10-CM) - Incomplete bladder emptying  THERAPY DIAG:  Unspecified lack of coordination  Muscle weakness (generalized)  Abnormal posture  Rationale for Evaluation and Treatment: Rehabilitation  ONSET DATE: chronic  SUBJECTIVE:                                                                                                                                                                                            SUBJECTIVE STATEMENT: Has urge to urinate every 30 mins, will have moderate leakage if not making it in time.   Fluid intake: Yes: water - 5 12 oz daily or a little more; coffee at least 2 cups daily    PAIN:  Are you having pain? Yes NPRS scale: 4/10 Pain location:  low back  Pain type: aching Pain description: intermittent   Aggravating factors: forward being for more than several minutes, laying  too long Relieving factors: goes away on its own  PRECAUTIONS: None  RED FLAGS: None   WEIGHT BEARING RESTRICTIONS: No  FALLS:  Has patient fallen in last 6 months? No  LIVING ENVIRONMENT: Lives with: lives with their family Lives in: House/apartment   OCCUPATION: not currently   PLOF: Independent  PATIENT GOALS: to have less frequency and leakage of urine  PERTINENT HISTORY:  ADHD, anxiety, x2 vaginal births, breast enhancement Sexual abuse: No  BOWEL MOVEMENT: Pain with bowel movement: No Type of bowel movement:Type (Bristol Stool Scale) 4, Frequency 2x daily, and Strain No Fully empty rectum: Yes:   Leakage: No Pads: No Fiber supplement: No  URINATION: Pain with urination: No Fully empty bladder: No Stream: Strong and Weak Urgency: Yes:   Frequency: always feels she has to pee, will push a lot during urination to try to empty but feels like never fully empty Leakage: Urge to void, Walking to the bathroom, Coughing, Sneezing, and Laughing Pads: No  INTERCOURSE: Pain with intercourse: Initial Penetration and During Penetration Ability to have vaginal penetration:  Yes: denies dryness  Climax: not painful Marinoff Scale: 0/3  PREGNANCY: Vaginal deliveries 2 Tearing Yes: did have tearing with first C-section deliveries 0 Currently pregnant No  PROLAPSE: Heaviness felt at vagina and rectum with bowel  movements   OBJECTIVE:  Note: Objective measures were completed at Evaluation unless otherwise noted.  DIAGNOSTIC FINDINGS:  Previous urology evaluation revealed incomplete bladder emptying.   PATIENT SURVEYS:    PFIQ-7 90  COGNITION: Overall cognitive status: Within functional limits for tasks assessed     SENSATION: Light touch: Appears intact Proprioception: Appears intact  MUSCLE LENGTH: Bil hamstrings and adductors limited by 50%  POSTURE: rounded shoulders  PELVIC ALIGNMENT: WFL  LUMBARAROM/PROM:  A/PROM A/PROM  eval  Flexion Limited by 25%  Extension WFL  Right lateral flexion Limited by 50%  Left lateral flexion Limited by 50%  Right rotation Limited by 25%  Left rotation Limited by 25%   (Blank rows = not tested)  LOWER EXTREMITY ROM:  WFL  LOWER EXTREMITY MMT:  Bil hips grossly 3+/5; knees 4+/5  PALPATION:   General  tightness at lumbar and thoracic paraspinals, piriformis, and fascial restrictions in mid and lower abdominal quadrants                External Perineal Exam no TTP, [facility]                             Internal Pelvic Floor tension noted in superficial and bil obturator internus and TTP in obturators and throughout superficial layers.   Patient confirms identification and approves PT to assess internal pelvic floor and treatment Yes No emotional/communication barriers or cognitive limitation. Patient is motivated to learn. Patient understands and agrees with treatment goals and plan. PT explains patient will be examined in standing, sitting, and lying down to see how their muscles and joints work. When they are ready, they will be asked to remove their underwear so PT can examine their perineum. The patient is also given the option of providing their own chaperone as one is not provided in our facility. The patient also has the right and is explained the right to defer or refuse any part of the evaluation or treatment including the internal  exam. With the patient's consent, PT will use one gloved finger to gently assess the muscles of the pelvic floor, seeing how well it contracts  and relaxes and if there is muscle symmetry. After, the patient will get dressed and PT and patient will discuss exam findings and plan of care. PT and patient discuss plan of care, schedule, attendance policy and HEP activities.  PELVIC MMT:   MMT eval  Vaginal 4/5, 10s, 7 reps  Internal Anal Sphincter   External Anal Sphincter   Puborectalis   Diastasis Recti   (Blank rows = not tested)        TONE: WFL  PROLAPSE: Mild anterior vaginal wall descent with cough in hooklying but minimal  TODAY'S TREATMENT:                                                                                                                              DATE:   10/20/23 EVAL Examination completed, findings reviewed, pt educated on POC, HEP. Pt motivated to participate in PT and agreeable to attempt recommendations.    If treatment provided at initial evaluation, no treatment charged due to lack of authorization.       PATIENT EDUCATION:  Education details: [phone] Person educated: Patient Education method: Explanation, Demonstration, Tactile cues, Verbal cues, and Handouts Education comprehension: verbalized understanding, returned demonstration, verbal cues required, tactile cues required, and needs further education  HOME EXERCISE PROGRAM: [phone]  ASSESSMENT:  CLINICAL IMPRESSION: Patient is a 38 y.o. female  who was seen today for physical therapy evaluation and treatment for incomplete bladder emptying, and increased urinary frequency and pain with intercourse. Pt found to have decreased flexibility in spine and hips, decreased core and hip strength, fascial restrictions in abdomen. Patient consented to internal pelvic floor assessment vaginally this date and found to have tension and TTP superficially and deep layers. Pt would benefit from additional PT to  further address deficits.    OBJECTIVE IMPAIRMENTS: decreased activity tolerance, decreased coordination, decreased endurance, decreased mobility, decreased strength, increased fascial restrictions, impaired flexibility, improper body mechanics, and pain.   ACTIVITY LIMITATIONS: lifting, sitting, standing, squatting, and continence  PARTICIPATION LIMITATIONS: interpersonal relationship and community activity  PERSONAL FACTORS: Time since onset of injury/illness/exacerbation and 1 comorbidity: x2 vaginal births  are also affecting patient's functional outcome.   REHAB POTENTIAL: Good  CLINICAL DECISION MAKING: Stable/uncomplicated  EVALUATION COMPLEXITY: Low   GOALS: Goals reviewed with patient? Yes  SHORT TERM GOALS: Target date: 11/17/23  Pt to be I with HEP.  Baseline: Goal status: INITIAL  2.  Pt will be able to go.5 to 1 hours in between voids without urgency or incontinence in order to improve QOL and perform all functional activities with less difficulty.   Baseline:  Goal status: INITIAL  3.  Pt to be I with urge drill for decreased urinary frequency.  Baseline:  Goal status: INITIAL  4.  Pt to be I with relaxation techniques for decreased tension at abdomen and pelvic floor. Baseline:  Goal status: INITIAL   LONG TERM GOALS: Target date: 02/17/24  Pt to be I with  advanced HEP.  Baseline:  Goal status: INITIAL  2.  Pt will have 50% less urgency due to bladder retraining and strengthening  Baseline:  Goal status: INITIAL  3.  Pt to report improved time between bladder voids to at least 2 hours for improved QOL with decreased urinary frequency.   Baseline:  Goal status: INITIAL  4. Pt to report no more than 2/10 pain for improved QOL.  Baseline:  Goal status: INITIAL   PLAN:  PT FREQUENCY: 1-2x/week  PT DURATION:  8 sessions  PLANNED INTERVENTIONS: 97110-Therapeutic exercises, 97530- Therapeutic activity, 97112- Neuromuscular re-education, 97535- Self  Care, [id number]- Manual therapy, [phone]- Aquatic Therapy, Patient/Family education, Taping, Dry Needling, Joint mobilization, Spinal mobilization, Scar mobilization, Cryotherapy, Moist heat, and Biofeedback  PLAN FOR NEXT SESSION: manual for abdomen and low back, voiding mechanics, breathing mechanics, and core and hip strengthening, pelvic floor coordination   Darryle Navy, PT, DPT 01/08/253:51 PM

## 2023-11-15 ENCOUNTER — Ambulatory Visit: Payer: Medicaid Other | Attending: Physician Assistant | Admitting: Physical Therapy

## 2023-11-15 DIAGNOSIS — R293 Abnormal posture: Secondary | ICD-10-CM | POA: Insufficient documentation

## 2023-11-15 DIAGNOSIS — M6281 Muscle weakness (generalized): Secondary | ICD-10-CM | POA: Diagnosis present

## 2023-11-15 DIAGNOSIS — R279 Unspecified lack of coordination: Secondary | ICD-10-CM | POA: Insufficient documentation

## 2023-11-15 NOTE — Therapy (Signed)
OUTPATIENT PHYSICAL THERAPY FEMALE PELVIC EVALUATION   Patient Name: Claudia Sharp MRN: [id number] DOB:28-Jul-1986, 38 y.o., female Today's Date: 11/15/2023  END OF SESSION:  PT End of Session - 11/15/23 1204     Visit Number 2    Date for PT Re-Evaluation 02/17/24    Authorization Type [facility]    PT Start Time 1145    PT Stop Time 1223    PT Time Calculation (min) 38 min    Activity Tolerance Patient tolerated treatment well    Behavior During Therapy WFL for tasks assessed/performed              Past Medical History:  Diagnosis Date   ADHD (attention deficit hyperactivity disorder)    Anxiety    Seasonal Asthma    no current  inhaler use   Past Surgical History:  Procedure Laterality Date   BREAST ENHANCEMENT SURGERY     DILATION AND CURETTAGE OF UTERUS N/A 01/12/2022   Procedure: DILATATION AND CURETTAGE;  Surgeon: Charlett Nose, MD;  Location: [facility] [place];  Service: Gynecology;  Laterality: N/A;   DILATION AND EVACUATION  11/20/2021   @ [facility]   LIPOSUCTION     OPERATIVE ULTRASOUND N/A 01/12/2022   Procedure: OPERATIVE ULTRASOUND;  Surgeon: Charlett Nose, MD;  Location: [facility] [place];  Service: Gynecology;  Laterality: N/A;   Patient Active Problem List   Diagnosis Date Noted   Attention deficit hyperactivity disorder (ADHD) 09/20/2023   Anxiety 09/20/2023   Speech disturbance 01/28/2022   Mild cognitive impairment 11/04/2021   Normal labor 05/07/2021   Spontaneous vaginal delivery 05/07/2021    PCP: Allwardt, Crist Infante, PA-C   REFERRING PROVIDER: Allwardt, Crist Infante, PA-C   REFERRING DIAG: R35.0 (ICD-10-CM) - Urinary frequency R33.9 (ICD-10-CM) - Incomplete bladder emptying  THERAPY DIAG:  Unspecified lack of coordination  Muscle weakness (generalized)  Abnormal posture  Rationale for Evaluation and Treatment: Rehabilitation  ONSET DATE: chronic  SUBJECTIVE:                                                                                                                                                                                            SUBJECTIVE STATEMENT: Has urge to urinate every 30 mins, will have moderate leakage if not making it in time.   Fluid intake: Yes: water - 5 12 oz daily or a little more; coffee at least 2 cups daily    PAIN:  Are you having pain? Yes NPRS scale: 4/10 Pain location:  low back  Pain type: aching Pain description: intermittent   Aggravating factors: forward being for more than several minutes,  laying too long Relieving factors: goes away on its own  PRECAUTIONS: None  RED FLAGS: None   WEIGHT BEARING RESTRICTIONS: No  FALLS:  Has patient fallen in last 6 months? No  LIVING ENVIRONMENT: Lives with: lives with their family Lives in: House/apartment   OCCUPATION: not currently   PLOF: Independent  PATIENT GOALS: to have less frequency and leakage of urine  PERTINENT HISTORY:  ADHD, anxiety, x2 vaginal births, breast enhancement Sexual abuse: No  BOWEL MOVEMENT: Pain with bowel movement: No Type of bowel movement:Type (Bristol Stool Scale) 4, Frequency 2x daily, and Strain No Fully empty rectum: Yes:   Leakage: No Pads: No Fiber supplement: No  URINATION: Pain with urination: No Fully empty bladder: No Stream: Strong and Weak Urgency: Yes:   Frequency: always feels she has to pee, will push a lot during urination to try to empty but feels like never fully empty Leakage: Urge to void, Walking to the bathroom, Coughing, Sneezing, and Laughing Pads: No  INTERCOURSE: Pain with intercourse: Initial Penetration and During Penetration Ability to have vaginal penetration:  Yes: denies dryness  Climax: not painful Marinoff Scale: 0/3  PREGNANCY: Vaginal deliveries 2 Tearing Yes: did have tearing with first C-section deliveries 0 Currently pregnant No  PROLAPSE: Heaviness felt at vagina and rectum with bowel  movements   OBJECTIVE:  Note: Objective measures were completed at Evaluation unless otherwise noted.  DIAGNOSTIC FINDINGS:  Previous urology evaluation revealed incomplete bladder emptying.   PATIENT SURVEYS:    PFIQ-7 90  COGNITION: Overall cognitive status: Within functional limits for tasks assessed     SENSATION: Light touch: Appears intact Proprioception: Appears intact  MUSCLE LENGTH: Bil hamstrings and adductors limited by 50%  POSTURE: rounded shoulders  PELVIC ALIGNMENT: WFL  LUMBARAROM/PROM:  A/PROM A/PROM  eval  Flexion Limited by 25%  Extension WFL  Right lateral flexion Limited by 50%  Left lateral flexion Limited by 50%  Right rotation Limited by 25%  Left rotation Limited by 25%   (Blank rows = not tested)  LOWER EXTREMITY ROM:  WFL  LOWER EXTREMITY MMT:  Bil hips grossly 3+/5; knees 4+/5  PALPATION:   General  tightness at lumbar and thoracic paraspinals, piriformis, and fascial restrictions in mid and lower abdominal quadrants                External Perineal Exam no TTP, [facility]                             Internal Pelvic Floor tension noted in superficial and bil obturator internus and TTP in obturators and throughout superficial layers.   Patient confirms identification and approves PT to assess internal pelvic floor and treatment Yes No emotional/communication barriers or cognitive limitation. Patient is motivated to learn. Patient understands and agrees with treatment goals and plan. PT explains patient will be examined in standing, sitting, and lying down to see how their muscles and joints work. When they are ready, they will be asked to remove their underwear so PT can examine their perineum. The patient is also given the option of providing their own chaperone as one is not provided in our facility. The patient also has the right and is explained the right to defer or refuse any part of the evaluation or treatment including the internal  exam. With the patient's consent, PT will use one gloved finger to gently assess the muscles of the pelvic floor, seeing how well it  contracts and relaxes and if there is muscle symmetry. After, the patient will get dressed and PT and patient will discuss exam findings and plan of care. PT and patient discuss plan of care, schedule, attendance policy and HEP activities.  PELVIC MMT:   MMT eval  Vaginal 4/5, 10s, 7 reps  Internal Anal Sphincter   External Anal Sphincter   Puborectalis   Diastasis Recti   (Blank rows = not tested)        TONE: WFL  PROLAPSE: Mild anterior vaginal wall descent with cough in hooklying but minimal  TODAY'S TREATMENT:                                                                                                                              DATE:   10/20/23 EVAL Examination completed, findings reviewed, pt educated on POC, HEP. Pt motivated to participate in PT and agreeable to attempt recommendations.    If treatment provided at initial evaluation, no treatment charged due to lack of authorization.      11/15/23: Pt given handouts and educated on bladder irritants and urge drill for decreased urinary frequency and leakage Pt requests HEP reprinted as she has not been able to do them as she lost them. This was completed and reviewed as well.  Piriformis stretch 3x30s each Windshield wipers x10 each Lumbar ball roll outs x10 each Opp hand/knee ball press 2x10 each Bridges 2x10 green band with hip abduction 2x10 squats (first set body weight, second set 10#) Farmers carry 10# 1000' each hand  PATIENT EDUCATION:  Education details: [phone] Person educated: Patient Education method: Explanation, Demonstration, Tactile cues, Verbal cues, and Handouts Education comprehension: verbalized understanding, returned demonstration, verbal cues required, tactile cues required, and needs further education  HOME EXERCISE  PROGRAM: [phone]  ASSESSMENT:  CLINICAL IMPRESSION: Patient presents for treatment, focus on education for urge drill and bladder irritants. And progressing to core and hip activations for decreased strain at pelvic floor. Pt tolerated well, did benefit from moderate cues for technique and decreased over use/gripping of abdominals or glutes but able to correct with good technique demonstrated. Pt able to complete full session without using restroom and denied leakage. Pt would benefit from additional PT to further address deficits.    OBJECTIVE IMPAIRMENTS: decreased activity tolerance, decreased coordination, decreased endurance, decreased mobility, decreased strength, increased fascial restrictions, impaired flexibility, improper body mechanics, and pain.   ACTIVITY LIMITATIONS: lifting, sitting, standing, squatting, and continence  PARTICIPATION LIMITATIONS: interpersonal relationship and community activity  PERSONAL FACTORS: Time since onset of injury/illness/exacerbation and 1 comorbidity: x2 vaginal births  are also affecting patient's functional outcome.   REHAB POTENTIAL: Good  CLINICAL DECISION MAKING: Stable/uncomplicated  EVALUATION COMPLEXITY: Low   GOALS: Goals reviewed with patient? Yes  SHORT TERM GOALS: Target date: 11/17/23  Pt to be I with HEP.  Baseline: Goal status: INITIAL  2.  Pt will be able to go.5 to 1 hours in between voids  without urgency or incontinence in order to improve QOL and perform all functional activities with less difficulty.   Baseline:  Goal status: INITIAL  3.  Pt to be I with urge drill for decreased urinary frequency.  Baseline:  Goal status: INITIAL  4.  Pt to be I with relaxation techniques for decreased tension at abdomen and pelvic floor. Baseline:  Goal status: INITIAL   LONG TERM GOALS: Target date: 02/17/24  Pt to be I with advanced HEP.  Baseline:  Goal status: INITIAL  2.  Pt will have 50% less urgency due to bladder  retraining and strengthening  Baseline:  Goal status: INITIAL  3.  Pt to report improved time between bladder voids to at least 2 hours for improved QOL with decreased urinary frequency.   Baseline:  Goal status: INITIAL  4. Pt to report no more than 2/10 pain for improved QOL.  Baseline:  Goal status: INITIAL   PLAN:  PT FREQUENCY: 1-2x/week  PT DURATION:  8 sessions  PLANNED INTERVENTIONS: 97110-Therapeutic exercises, 97530- Therapeutic activity, 97112- Neuromuscular re-education, 97535- Self Care, [id number]- Manual therapy, [phone]- Aquatic Therapy, Patient/Family education, Taping, Dry Needling, Joint mobilization, Spinal mobilization, Scar mobilization, Cryotherapy, Moist heat, and Biofeedback  PLAN FOR NEXT SESSION: manual for abdomen and low back, voiding mechanics, breathing mechanics, and core and hip strengthening, pelvic floor coordination   Otelia Sergeant, PT, DPT 11/14/2510:24 PM

## 2023-11-15 NOTE — Patient Instructions (Signed)

## 2023-12-15 ENCOUNTER — Ambulatory Visit: Payer: Medicaid Other | Admitting: Physical Therapy

## 2023-12-16 ENCOUNTER — Ambulatory Visit: Payer: Self-pay | Admitting: Neurology

## 2023-12-16 ENCOUNTER — Encounter: Payer: Self-pay | Admitting: Neurology

## 2023-12-16 VITALS — BP 125/86 | HR 102 | Ht 63.0 in | Wt 164.4 lb

## 2023-12-16 DIAGNOSIS — G43709 Chronic migraine without aura, not intractable, without status migrainosus: Secondary | ICD-10-CM | POA: Diagnosis not present

## 2023-12-16 MED ORDER — SUMATRIPTAN SUCCINATE 50 MG PO TABS: 50.0000 mg | ORAL_TABLET | ORAL | 6 refills | Status: DC | PRN

## 2023-12-16 NOTE — Progress Notes (Signed)
 Chief Complaint  Patient presents with   Room 15    Pt is here with her baby. Pt states that she will get everyday. Pt states that she will have some nausea with her headaches. Pt states that she has some light sensitivity.  Pt states that her bones in her eyes hurt due to her headaches.       ASSESSMENT AND PLAN  Claudia Sharp is a 38 y.o. female   Chronic headache with visual phenomena  Most consistent with migraine headaches  Normal neurological examination was normal MRI of the brain from November 2023  Imitrex as needed  DIAGNOSTIC DATA (LABS, IMAGING, TESTING) - I reviewed patient records, labs, notes, testing and imaging myself where available.   MEDICAL HISTORY:  Claudia Sharp, is a 38 year old female seen in request by her primary care doctor Allwardt, Alyssa for evaluation of frequent headaches with visual changes,     History is obtained from the patient and review of electronic medical records. I personally reviewed pertinent available imaging films in PACS.   PMHx of  ADHD Depression, anxiety  She had long history of headache, getting worse since October 2024, described frequent dizziness, sometimes with colorful squeaky lines in her visual field, lasting for seconds to minutes, often associated with pressure headaches, with light noise sensitivity, occasionally over-the-counter Tylenol ibuprofen was helpful  She seems to have noticed increased over the past few months, MRI of the brain without contrast was normal in November 2023  PHYSICAL EXAM:   Vitals:   12/16/23 1413  BP: 125/86  Pulse: (!) 102  Weight: 164 lb 6.4 oz (74.6 kg)  Height: 5\' 3"  (1.6 m)   Not recorded     Body mass index is 29.12 kg/m.  PHYSICAL EXAMNIATION:  Gen: NAD, conversant, well nourised, well groomed                     Cardiovascular: Regular rate rhythm, no peripheral edema, warm, nontender. Eyes: Conjunctivae clear without exudates or hemorrhage Neck: Supple, no  carotid bruits. Pulmonary: Clear to auscultation bilaterally   NEUROLOGICAL EXAM:  MENTAL STATUS: Speech/cognition: Awake, alert, oriented to history taking and casual conversation CRANIAL NERVES: CN II: Visual fields are full to confrontation. Pupils are round equal and briskly reactive to light. CN III, IV, VI: extraocular movement are normal. No ptosis. CN V: Facial sensation is intact to light touch CN VII: Face is symmetric with normal eye closure  CN VIII: Hearing is normal to causal conversation. CN IX, X: Phonation is normal. CN XI: Head turning and shoulder shrug are intact  MOTOR: There is no pronator drift of out-stretched arms. Muscle bulk and tone are normal. Muscle strength is normal.  REFLEXES: Reflexes are 2+ and symmetric at the biceps, triceps, knees, and ankles. Plantar responses are flexor.  SENSORY: Intact to light touch, pinprick and vibratory sensation are intact in fingers and toes.  COORDINATION: There is no trunk or limb dysmetria noted.  GAIT/STANCE: Posture is normal. Gait is steady with normal steps, base, arm swing, and turning. Heel and toe walking are normal. Tandem gait is normal.  Romberg is absent.  REVIEW OF SYSTEMS:  Full 14 system review of systems performed and notable only for as above All other review of systems were negative.   ALLERGIES: No Known Allergies  HOME MEDICATIONS: Current Outpatient Medications  Medication Sig Dispense Refill   albuterol (PROAIR HFA) 108 (90 Base) MCG/ACT inhaler Inhale 2 puffs into the lungs every 6 (  six) hours as needed for wheezing or shortness of breath. 18 each 2   amphetamine-dextroamphetamine (ADDERALL) 15 MG tablet Take 15 mg by mouth daily.     DULoxetine (CYMBALTA) 60 MG capsule Take 60 mg by mouth 2 (two) times daily.     etonogestrel (NEXPLANON) 68 MG IMPL implant 1 each by Subdermal route once.     traZODone (DESYREL) 50 MG tablet Take 25 mg by mouth at bedtime.     tretinoin (RETIN-A)  0.025 % cream Apply topically at bedtime.     triamcinolone cream (KENALOG) 0.1 % Apply 1 Application topically 2 (two) times daily. Apply thin layer to affected areas no longer than 2 weeks consistently then sparingly. 60 g 1   Prenatal Vit-Fe Fumarate-FA (PRENATAL MULTIVITAMIN) TABS tablet Take 1 tablet by mouth daily at 12 noon. (Patient not taking: Reported on 12/16/2023)     No current facility-administered medications for this visit.    PAST MEDICAL HISTORY: Past Medical History:  Diagnosis Date   ADHD (attention deficit hyperactivity disorder)    Anxiety    Seasonal Asthma    no current  inhaler use    PAST SURGICAL HISTORY: Past Surgical History:  Procedure Laterality Date   BREAST ENHANCEMENT SURGERY     DILATION AND CURETTAGE OF UTERUS N/A 01/12/2022   Procedure: DILATATION AND CURETTAGE;  Surgeon: Charlett Nose, MD;  Location: [facility];  Service: Gynecology;  Laterality: N/A;   DILATION AND EVACUATION  11/20/2021   @ [facility]   LIPOSUCTION     OPERATIVE ULTRASOUND N/A 01/12/2022   Procedure: OPERATIVE ULTRASOUND;  Surgeon: Charlett Nose, MD;  Location: [facility];  Service: Gynecology;  Laterality: N/A;    FAMILY HISTORY: Family History  Problem Relation Age of Onset   Asthma Mother     SOCIAL HISTORY: Social History   Socioeconomic History   Marital status: Single    Spouse name: Not on file   Number of children: 2   Years of education: Not on file   Highest education level: Not on file  Occupational History   Occupation: stay at home  Tobacco Use   Smoking status: Never    Passive exposure: Never   Smokeless tobacco: Never  Vaping Use   Vaping status: Every Day   Substances: Nicotine  Substance and Sexual Activity   Alcohol use: Not Currently    Comment: very rare   Drug use: Never   Sexual activity: Yes    Birth control/protection: Implant  Other Topics Concern   Not on file  Social History Narrative    Not on file   Social Drivers of Health   Financial Resource Strain: Not on file  Food Insecurity: Not on file  Transportation Needs: Not on file  Physical Activity: Not on file  Stress: Not on file  Social Connections: Not on file  Intimate Partner Violence: Not on file      Levert Feinstein, M.D. Ph.D.  [facility] [address] [place], [place] [address] Ph: [phone] Fax: [phone]  CC:  Allwardt, Crist Infante, PA-C [address] [place],  [place] [address]  Allwardt, Crist Infante, PA-C

## 2023-12-23 ENCOUNTER — Ambulatory Visit: Payer: Medicaid Other | Admitting: Physical Therapy

## 2023-12-25 ENCOUNTER — Telehealth: Admitting: Family Medicine

## 2023-12-25 DIAGNOSIS — J019 Acute sinusitis, unspecified: Secondary | ICD-10-CM

## 2023-12-25 MED ORDER — PREDNISONE 10 MG (21) PO TBPK: ORAL_TABLET | ORAL | 0 refills | Status: DC

## 2023-12-25 MED ORDER — FLUTICASONE PROPIONATE 50 MCG/ACT NA SUSP: 2.0000 | Freq: Every day | NASAL | 0 refills | Status: DC

## 2023-12-25 MED ORDER — DOXYCYCLINE HYCLATE 100 MG PO TABS: 100.0000 mg | ORAL_TABLET | Freq: Two times a day (BID) | ORAL | 0 refills | Status: AC

## 2023-12-25 NOTE — Progress Notes (Signed)
 Virtual Visit Consent   Claudia Sharp, you are scheduled for a virtual visit with a [facility] provider today. Just as with appointments in the office, your consent must be obtained to participate. Your consent will be active for this visit and any virtual visit you may have with one of our providers in the next 365 days. If you have a MyChart account, a copy of this consent can be sent to you electronically.  As this is a virtual visit, video technology does not allow for your provider to perform a traditional examination. This may limit your provider's ability to fully assess your condition. If your provider identifies any concerns that need to be evaluated in person or the need to arrange testing (such as labs, EKG, etc.), we will make arrangements to do so. Although advances in technology are sophisticated, we cannot ensure that it will always work on either your end or our end. If the connection with a video visit is poor, the visit may have to be switched to a telephone visit. With either a video or telephone visit, we are not always able to ensure that we have a secure connection.  By engaging in this virtual visit, you consent to the provision of healthcare and authorize for your insurance to be billed (if applicable) for the services provided during this visit. Depending on your insurance coverage, you may receive a charge related to this service.  I need to obtain your verbal consent now. Are you willing to proceed with your visit today? Terry Jantz has provided verbal consent on 12/25/2023 for a virtual visit (video or telephone). Reed Pandy, [place]  Date: 12/25/2023 2:20 PM   Virtual Visit via Video Note   I, Reed Pandy, connected with  Claudia Sharp  ([id number], Apr 24, 1986) on 12/25/23 at  2:15 PM EDT by a video-enabled telemedicine application and verified that I am speaking with the correct person using two identifiers.  Location: Patient: Virtual Visit Location Patient:  Home Provider: Virtual Visit Location Provider: Home Office   I discussed the limitations of evaluation and management by telemedicine and the availability of in person appointments. The patient expressed understanding and agreed to proceed.    History of Present Illness: Claudia Sharp is a 38 y.o. who identifies as a female who was assigned female at birth, and is being seen today for c/o suffering with allergies and takes over the counter medications.  Pt states she is prone to upper respiratory symptoms and has woken up feeling weak and shaking.  Pt states inhaler is not working and it is hard to breath.  Pt states used her nebulizer and that helped.  Pt states clear mucus with a little blood. Pt states she feels very congested.  Pt states nose is runny. Pt states she has a history of asthma and states symptoms on going for two weeks.   HPI: HPI  Problems:  Patient Active Problem List   Diagnosis Date Noted   Chronic migraine w/o aura w/o status migrainosus, not intractable 12/16/2023   Attention deficit hyperactivity disorder (ADHD) 09/20/2023   Anxiety 09/20/2023   Speech disturbance 01/28/2022   Mild cognitive impairment 11/04/2021   Normal labor 05/07/2021   Spontaneous vaginal delivery 05/07/2021    Allergies: No Known Allergies Medications:  Current Outpatient Medications:    doxycycline (VIBRA-TABS) 100 MG tablet, Take 1 tablet (100 mg total) by mouth 2 (two) times daily for 7 days., Disp: 14 tablet, Rfl: 0   fluticasone (FLONASE) 50 MCG/ACT nasal spray,  Place 2 sprays into both nostrils daily., Disp: 16 g, Rfl: 0   predniSONE (STERAPRED UNI-PAK 21 TAB) 10 MG (21) TBPK tablet, Take following package directions., Disp: 21 tablet, Rfl: 0   albuterol (PROAIR HFA) 108 (90 Base) MCG/ACT inhaler, Inhale 2 puffs into the lungs every 6 (six) hours as needed for wheezing or shortness of breath., Disp: 18 each, Rfl: 2   amphetamine-dextroamphetamine (ADDERALL) 15 MG tablet, Take 15 mg by  mouth daily., Disp: , Rfl:    DULoxetine (CYMBALTA) 60 MG capsule, Take 60 mg by mouth 2 (two) times daily., Disp: , Rfl:    etonogestrel (NEXPLANON) 68 MG IMPL implant, 1 each by Subdermal route once., Disp: , Rfl:    Prenatal Vit-Fe Fumarate-FA (PRENATAL MULTIVITAMIN) TABS tablet, Take 1 tablet by mouth daily at 12 noon. (Patient not taking: Reported on 12/16/2023), Disp: , Rfl:    SUMAtriptan (IMITREX) 50 MG tablet, Take 1 tablet (50 mg total) by mouth every 2 (two) hours as needed for migraine. May repeat in 2 hours if headache persists or recurs., Disp: 10 tablet, Rfl: 6   traZODone (DESYREL) 50 MG tablet, Take 25 mg by mouth at bedtime., Disp: , Rfl:    tretinoin (RETIN-A) 0.025 % cream, Apply topically at bedtime., Disp: , Rfl:    triamcinolone cream (KENALOG) 0.1 %, Apply 1 Application topically 2 (two) times daily. Apply thin layer to affected areas no longer than 2 weeks consistently then sparingly., Disp: 60 g, Rfl: 1  Observations/Objective: Patient is well-developed, well-nourished in no acute distress.  Resting comfortably at home.  Head is normocephalic, atraumatic.  No labored breathing.  Speech is clear and coherent with logical content.  Patient is alert and oriented at baseline.    Assessment and Plan: 1. Acute sinusitis, recurrence not specified, unspecified location (Primary) - doxycycline (VIBRA-TABS) 100 MG tablet; Take 1 tablet (100 mg total) by mouth 2 (two) times daily for 7 days.  Dispense: 14 tablet; Refill: 0 - predniSONE (STERAPRED UNI-PAK 21 TAB) 10 MG (21) TBPK tablet; Take following package directions.  Dispense: 21 tablet; Refill: 0 - fluticasone (FLONASE) 50 MCG/ACT nasal spray; Place 2 sprays into both nostrils daily.  Dispense: 16 g; Refill: 0  -Will treat for possible sinus infection  -Differential Dx. Also includes asthma exacerbation  -Advised Pt the use of condoms while taking the birth control and antibiotics as this can lower effectiveness of birth  control.  Pt verbalized understanding   Follow Up Instructions: I discussed the assessment and treatment plan with the patient. The patient was provided an opportunity to ask questions and all were answered. The patient agreed with the plan and demonstrated an understanding of the instructions.  A copy of instructions were sent to the patient via MyChart unless otherwise noted below.     The patient was advised to call back or seek an in-person evaluation if the symptoms worsen or if the condition fails to improve as anticipated.    Reed Pandy, PA-C

## 2023-12-25 NOTE — Patient Instructions (Signed)
 Tanekia Schillaci, thank you for joining Reed Pandy, PA-C for today's virtual visit.  While this provider is not your primary care provider (PCP), if your PCP is located in our provider database this encounter information will be shared with them immediately following your visit.   A [facility] MyChart account gives you access to today's visit and all your visits, tests, and labs performed at [facility] " click here if you don't have a [facility] MyChart account or go to [email or web address]  Consent: (Patient) Ertha Badami provided verbal consent for this virtual visit at the beginning of the encounter.  Current Medications:  Current Outpatient Medications:    doxycycline (VIBRA-TABS) 100 MG tablet, Take 1 tablet (100 mg total) by mouth 2 (two) times daily for 7 days., Disp: 14 tablet, Rfl: 0   fluticasone (FLONASE) 50 MCG/ACT nasal spray, Place 2 sprays into both nostrils daily., Disp: 16 g, Rfl: 0   predniSONE (STERAPRED UNI-PAK 21 TAB) 10 MG (21) TBPK tablet, Take following package directions., Disp: 21 tablet, Rfl: 0   albuterol (PROAIR HFA) 108 (90 Base) MCG/ACT inhaler, Inhale 2 puffs into the lungs every 6 (six) hours as needed for wheezing or shortness of breath., Disp: 18 each, Rfl: 2   amphetamine-dextroamphetamine (ADDERALL) 15 MG tablet, Take 15 mg by mouth daily., Disp: , Rfl:    DULoxetine (CYMBALTA) 60 MG capsule, Take 60 mg by mouth 2 (two) times daily., Disp: , Rfl:    etonogestrel (NEXPLANON) 68 MG IMPL implant, 1 each by Subdermal route once., Disp: , Rfl:    Prenatal Vit-Fe Fumarate-FA (PRENATAL MULTIVITAMIN) TABS tablet, Take 1 tablet by mouth daily at 12 noon. (Patient not taking: Reported on 12/16/2023), Disp: , Rfl:    SUMAtriptan (IMITREX) 50 MG tablet, Take 1 tablet (50 mg total) by mouth every 2 (two) hours as needed for migraine. May repeat in 2 hours if headache persists or recurs., Disp: 10 tablet, Rfl: 6   traZODone (DESYREL) 50 MG tablet,  Take 25 mg by mouth at bedtime., Disp: , Rfl:    tretinoin (RETIN-A) 0.025 % cream, Apply topically at bedtime., Disp: , Rfl:    triamcinolone cream (KENALOG) 0.1 %, Apply 1 Application topically 2 (two) times daily. Apply thin layer to affected areas no longer than 2 weeks consistently then sparingly., Disp: 60 g, Rfl: 1   Medications ordered in this encounter:  Meds ordered this encounter  Medications   doxycycline (VIBRA-TABS) 100 MG tablet    Sig: Take 1 tablet (100 mg total) by mouth 2 (two) times daily for 7 days.    Dispense:  14 tablet    Refill:  0   predniSONE (STERAPRED UNI-PAK 21 TAB) 10 MG (21) TBPK tablet    Sig: Take following package directions.    Dispense:  21 tablet    Refill:  0    Please dispense one standard blister pack taper.   fluticasone (FLONASE) 50 MCG/ACT nasal spray    Sig: Place 2 sprays into both nostrils daily.    Dispense:  16 g    Refill:  0     *If you need refills on other medications prior to your next appointment, please contact your pharmacy*  Follow-Up: Call back or seek an in-person evaluation if the symptoms worsen or if the condition fails to improve as anticipated.  [facility] [phone]  Other Instructions Sinus Infection, Adult A sinus infection, also called sinusitis, is inflammation of your sinuses. Sinuses are hollow spaces in  the bones around your face. Your sinuses are located: Around your eyes. In the middle of your forehead. Behind your nose. In your cheekbones. Mucus normally drains out of your sinuses. When your nasal tissues become inflamed or swollen, mucus can become trapped or blocked. This allows bacteria, viruses, and fungi to grow, which leads to infection. Most infections of the sinuses are caused by a virus. A sinus infection can develop quickly. It can last for up to 4 weeks (acute) or for more than 12 weeks (chronic). A sinus infection often develops after a cold. What are the causes? This  condition is caused by anything that creates swelling in the sinuses or stops mucus from draining. This includes: Allergies. Asthma. Infection from bacteria or viruses. Deformities or blockages in your nose or sinuses. Abnormal growths in the nose (nasal polyps). Pollutants, such as chemicals or irritants in the air. Infection from fungi. This is rare. What increases the risk? You are more likely to develop this condition if you: Have a weak body defense system (immune system). Do a lot of swimming or diving. Overuse nasal sprays. Smoke. What are the signs or symptoms? The main symptoms of this condition are pain and a feeling of pressure around the affected sinuses. Other symptoms include: Stuffy nose or congestion that makes it difficult to breathe through your nose. Thick yellow or greenish drainage from your nose. Tenderness, swelling, and warmth over the affected sinuses. A cough that may get worse at night. Decreased sense of smell and taste. Extra mucus that collects in the throat or the back of the nose (postnasal drip) causing a sore throat or bad breath. Tiredness (fatigue). Fever. How is this diagnosed? This condition is diagnosed based on: Your symptoms. Your medical history. A physical exam. Tests to find out if your condition is acute or chronic. This may include: Checking your nose for nasal polyps. Viewing your sinuses using a device that has a light (endoscope). Testing for allergies or bacteria. Imaging tests, such as an MRI or CT scan. In rare cases, a bone biopsy may be done to rule out more serious types of fungal sinus disease. How is this treated? Treatment for a sinus infection depends on the cause and whether your condition is chronic or acute. If caused by a virus, your symptoms should go away on their own within 10 days. You may be given medicines to relieve symptoms. They include: Medicines that shrink swollen nasal passages (decongestants). A spray  that eases inflammation of the nostrils (topical intranasal corticosteroids). Rinses that help get rid of thick mucus in your nose (nasal saline washes). Medicines that treat allergies (antihistamines). Over-the-counter pain relievers. If caused by bacteria, your health care provider may recommend waiting to see if your symptoms improve. Most bacterial infections will get better without antibiotic medicine. You may be given antibiotics if you have: A severe infection. A weak immune system. If caused by narrow nasal passages or nasal polyps, surgery may be needed. Follow these instructions at home: Medicines Take, use, or apply over-the-counter and prescription medicines only as told by your health care provider. These may include nasal sprays. If you were prescribed an antibiotic medicine, take it as told by your health care provider. Do not stop taking the antibiotic even if you start to feel better. Hydrate and humidify  Drink enough fluid to keep your urine pale yellow. Staying hydrated will help to thin your mucus. Use a cool mist humidifier to keep the humidity level in your home above  50%. Inhale steam for 10-15 minutes, 3-4 times a day, or as told by your health care provider. You can do this in the bathroom while a hot shower is running. Limit your exposure to cool or dry air. Rest Rest as much as possible. Sleep with your head raised (elevated). Make sure you get enough sleep each night. General instructions  Apply a warm, moist washcloth to your face 3-4 times a day or as told by your health care provider. This will help with discomfort. Use nasal saline washes as often as told by your health care provider. Wash your hands often with soap and water to reduce your exposure to germs. If soap and water are not available, use hand sanitizer. Do not smoke. Avoid being around people who are smoking (secondhand smoke). Keep all follow-up visits. This is important. Contact a health  care provider if: You have a fever. Your symptoms get worse. Your symptoms do not improve within 10 days. Get help right away if: You have a severe headache. You have persistent vomiting. You have severe pain or swelling around your face or eyes. You have vision problems. You develop confusion. Your neck is stiff. You have trouble breathing. These symptoms may be an emergency. Get help right away. Call 911. Do not wait to see if the symptoms will go away. Do not drive yourself to the hospital. Summary A sinus infection is soreness and inflammation of your sinuses. Sinuses are hollow spaces in the bones around your face. This condition is caused by nasal tissues that become inflamed or swollen. The swelling traps or blocks the flow of mucus. This allows bacteria, viruses, and fungi to grow, which leads to infection. If you were prescribed an antibiotic medicine, take it as told by your health care provider. Do not stop taking the antibiotic even if you start to feel better. Keep all follow-up visits. This is important. This information is not intended to replace advice given to you by your health care provider. Make sure you discuss any questions you have with your health care provider. Document Revised: 09/02/2021 Document Reviewed: 09/02/2021 Elsevier Patient Education  2024 [employer]vier Inc.   If you have been instructed to have an in-person evaluation today at a local Urgent Care facility, please use the link below. It will take you to a list of all of our available [facility], including address, phone number and hours of operation. Please do not delay care.  [facility]  If you or a family member do not have a primary care provider, use the link below to schedule a visit and establish care. When you choose a [facility] primary care physician or advanced practice provider, you gain a long-term partner in health. Find a Primary Care Provider  Learn more about  [facility] in-office and virtual care options: [facility] Get Care Now

## 2023-12-27 ENCOUNTER — Ambulatory Visit: Payer: Medicaid Other | Attending: Physician Assistant | Admitting: Physical Therapy

## 2023-12-27 DIAGNOSIS — R293 Abnormal posture: Secondary | ICD-10-CM | POA: Diagnosis present

## 2023-12-27 DIAGNOSIS — R279 Unspecified lack of coordination: Secondary | ICD-10-CM | POA: Insufficient documentation

## 2023-12-27 DIAGNOSIS — M6281 Muscle weakness (generalized): Secondary | ICD-10-CM | POA: Insufficient documentation

## 2023-12-27 NOTE — Therapy (Signed)
 OUTPATIENT PHYSICAL THERAPY FEMALE PELVIC TREATMENT   Patient Name: Claudia Sharp MRN: [id number] DOB:1986-06-08, 38 y.o., female Today's Date: 12/27/2023  END OF SESSION:  PT End of Session - 12/27/23 1107     Visit Number 3    Date for PT Re-Evaluation 02/17/24    Authorization Type [employer]    PT Start Time 1106    PT Stop Time 1145    PT Time Calculation (min) 39 min    Activity Tolerance Patient tolerated treatment well    Behavior During Therapy WFL for tasks assessed/performed              Past Medical History:  Diagnosis Date   ADHD (attention deficit hyperactivity disorder)    Anxiety    Seasonal Asthma    no current  inhaler use   Past Surgical History:  Procedure Laterality Date   BREAST ENHANCEMENT SURGERY     DILATION AND CURETTAGE OF UTERUS N/A 01/12/2022   Procedure: DILATATION AND CURETTAGE;  Surgeon: Charlett Nose, MD;  Location: [facility];  Service: Gynecology;  Laterality: N/A;   DILATION AND EVACUATION  11/20/2021   @ [facility]   LIPOSUCTION     OPERATIVE ULTRASOUND N/A 01/12/2022   Procedure: OPERATIVE ULTRASOUND;  Surgeon: Charlett Nose, MD;  Location: [facility];  Service: Gynecology;  Laterality: N/A;   Patient Active Problem List   Diagnosis Date Noted   Chronic migraine w/o aura w/o status migrainosus, not intractable 12/16/2023   Attention deficit hyperactivity disorder (ADHD) 09/20/2023   Anxiety 09/20/2023   Speech disturbance 01/28/2022   Mild cognitive impairment 11/04/2021   Normal labor 05/07/2021   Spontaneous vaginal delivery 05/07/2021    PCP: Allwardt, Crist Infante, PA-C   REFERRING PROVIDER: Allwardt, Crist Infante, PA-C   REFERRING DIAG: R35.0 (ICD-10-CM) - Urinary frequency R33.9 (ICD-10-CM) - Incomplete bladder emptying  THERAPY DIAG:  Muscle weakness (generalized)  Unspecified lack of coordination  Abnormal posture  Rationale for Evaluation and Treatment:  Rehabilitation  ONSET DATE: chronic  SUBJECTIVE:                                                                                                                                                                                           SUBJECTIVE STATEMENT: Reports urgency seems the same and I've had a sinus infection and has been having more leakage. Does think she feel sees a bulge or muscles coming forward when trying to contract pelvic floor. Does report pain has been much less now.   Fluid intake: Yes: water - 5 12 oz daily or a little more; coffee at least 2 cups  daily    PAIN:  12/27/23 no Are you having pain? Yes NPRS scale: 4/10 Pain location:  low back  Pain type: aching Pain description: intermittent   Aggravating factors: forward being for more than several minutes, laying too long Relieving factors: goes away on its own  PRECAUTIONS: None  RED FLAGS: None   WEIGHT BEARING RESTRICTIONS: No  FALLS:  Has patient fallen in last 6 months? No  LIVING ENVIRONMENT: Lives with: lives with their family Lives in: House/apartment   OCCUPATION: not currently   PLOF: Independent  PATIENT GOALS: to have less frequency and leakage of urine  PERTINENT HISTORY:  ADHD, anxiety, x2 vaginal births, breast enhancement Sexual abuse: No  BOWEL MOVEMENT: Pain with bowel movement: No Type of bowel movement:Type (Bristol Stool Scale) 4, Frequency 2x daily, and Strain No Fully empty rectum: Yes:   Leakage: No Pads: No Fiber supplement: No  URINATION: Pain with urination: No Fully empty bladder: No Stream: Strong and Weak Urgency: Yes:   Frequency: always feels she has to pee, will push a lot during urination to try to empty but feels like never fully empty Leakage: Urge to void, Walking to the bathroom, Coughing, Sneezing, and Laughing Pads: No  INTERCOURSE: Pain with intercourse: Initial Penetration and During Penetration Ability to have vaginal penetration:  Yes:  denies dryness  Climax: not painful Marinoff Scale: 0/3  PREGNANCY: Vaginal deliveries 2 Tearing Yes: did have tearing with first C-section deliveries 0 Currently pregnant No  PROLAPSE: Heaviness felt at vagina and rectum with bowel movements   OBJECTIVE:  Note: Objective measures were completed at Evaluation unless otherwise noted.  DIAGNOSTIC FINDINGS:  Previous urology evaluation revealed incomplete bladder emptying.   PATIENT SURVEYS:    PFIQ-7 90  COGNITION: Overall cognitive status: Within functional limits for tasks assessed     SENSATION: Light touch: Appears intact Proprioception: Appears intact  MUSCLE LENGTH: Bil hamstrings and adductors limited by 50%  POSTURE: rounded shoulders  PELVIC ALIGNMENT: WFL  LUMBARAROM/PROM:  A/PROM A/PROM  eval  Flexion Limited by 25%  Extension WFL  Right lateral flexion Limited by 50%  Left lateral flexion Limited by 50%  Right rotation Limited by 25%  Left rotation Limited by 25%   (Blank rows = not tested)  LOWER EXTREMITY ROM:  WFL  LOWER EXTREMITY MMT:  Bil hips grossly 3+/5; knees 4+/5  PALPATION:   General  tightness at lumbar and thoracic paraspinals, piriformis, and fascial restrictions in mid and lower abdominal quadrants                External Perineal Exam no TTP, [facility]                             Internal Pelvic Floor tension noted in superficial and bil obturator internus and TTP in obturators and throughout superficial layers.   Patient confirms identification and approves PT to assess internal pelvic floor and treatment Yes No emotional/communication barriers or cognitive limitation. Patient is motivated to learn. Patient understands and agrees with treatment goals and plan. PT explains patient will be examined in standing, sitting, and lying down to see how their muscles and joints work. When they are ready, they will be asked to remove their underwear so PT can examine their perineum. The  patient is also given the option of providing their own chaperone as one is not provided in our facility. The patient also has the right and is explained the  right to defer or refuse any part of the evaluation or treatment including the internal exam. With the patient's consent, PT will use one gloved finger to gently assess the muscles of the pelvic floor, seeing how well it contracts and relaxes and if there is muscle symmetry. After, the patient will get dressed and PT and patient will discuss exam findings and plan of care. PT and patient discuss plan of care, schedule, attendance policy and HEP activities.  PELVIC MMT:   MMT eval  Vaginal 4/5, 10s, 7 reps  Internal Anal Sphincter   External Anal Sphincter   Puborectalis   Diastasis Recti   (Blank rows = not tested)        TONE: WFL  PROLAPSE: Mild anterior vaginal wall descent with cough in hooklying but minimal  TODAY'S TREATMENT:                                                                                                                              DATE:   10/20/23 EVAL Examination completed, findings reviewed, pt educated on POC, HEP. Pt motivated to participate in PT and agreeable to attempt recommendations.    If treatment provided at initial evaluation, no treatment charged due to lack of authorization.      11/15/23: Pt given handouts and educated on bladder irritants and urge drill for decreased urinary frequency and leakage Pt requests HEP reprinted as she has not been able to do them as she lost them. This was completed and reviewed as well.  Piriformis stretch 3x30s each Windshield wipers x10 each Lumbar ball roll outs x10 each Opp hand/knee ball press 2x10 each Bridges 2x10 green band with hip abduction 2x10 squats (first set body weight, second set 10#) Farmers carry 10# 1000' each hand  12/27/23: Pt reports she sees a bulge at vagina with pushing for bowel movement and wanted to check this today. Pt does  continue to demonstrate possible grade 1 anterior vaginal wall laxity with cough but when cued to bring knees up to push "as if she was pushing for bowel movement." Pt does demonstrate worsening of laxity to possibly grade 2 however with reassessing pelvic floor contractions pt demonstrated good techniques and strength but endurance around 6s without compensatory strategies. And pt educated on importance of not straining with bowel movement and to attempt balloon breathing instead. Pt demonstrated improvement with this and used long arm mirror to improve visual feedback of laxity and pt able to improve this.  2x10 pelvic floor contractions, x10 6-7s isometrics, x10 quick flicks with pt overall demonstrating no downward strain at all with pelvic floor contractions only with straining.   PATIENT EDUCATION:  Education details: [phone] Person educated: Patient Education method: Explanation, Demonstration, Tactile cues, Verbal cues, and Handouts Education comprehension: verbalized understanding, returned demonstration, verbal cues required, tactile cues required, and needs further education  HOME EXERCISE PROGRAM: [phone]  ASSESSMENT:  CLINICAL IMPRESSION: Patient presents for treatment, focus on education for  urge drill and bladder irritants. Pt reports visualization of bulge at vaginal opening with straining to have bowel movement. Pt states she doesn't have sensation of pressure or heaviness however did see it with this. Pt demonstrates good techniques with pelvic floor mobility however when cued for simulating straining replicated visualized laxity. Pt had good results with visual feedback of mirror and demonstrated no bulge without bearing down. Pt educated not to strain for bowel movement and reports she will stop this. Pt would benefit from additional PT to further address deficits.    OBJECTIVE IMPAIRMENTS: decreased activity tolerance, decreased coordination, decreased endurance, decreased  mobility, decreased strength, increased fascial restrictions, impaired flexibility, improper body mechanics, and pain.   ACTIVITY LIMITATIONS: lifting, sitting, standing, squatting, and continence  PARTICIPATION LIMITATIONS: interpersonal relationship and community activity  PERSONAL FACTORS: Time since onset of injury/illness/exacerbation and 1 comorbidity: x2 vaginal births  are also affecting patient's functional outcome.   REHAB POTENTIAL: Good  CLINICAL DECISION MAKING: Stable/uncomplicated  EVALUATION COMPLEXITY: Low   GOALS: Goals reviewed with patient? Yes  SHORT TERM GOALS: Target date: 11/17/23  Pt to be I with HEP.  Baseline: Goal status: INITIAL  2.  Pt will be able to go.5 to 1 hours in between voids without urgency or incontinence in order to improve QOL and perform all functional activities with less difficulty.   Baseline:  Goal status: INITIAL  3.  Pt to be I with urge drill for decreased urinary frequency.  Baseline:  Goal status: INITIAL  4.  Pt to be I with relaxation techniques for decreased tension at abdomen and pelvic floor. Baseline:  Goal status: INITIAL   LONG TERM GOALS: Target date: 02/17/24  Pt to be I with advanced HEP.  Baseline:  Goal status: INITIAL  2.  Pt will have 50% less urgency due to bladder retraining and strengthening  Baseline:  Goal status: INITIAL  3.  Pt to report improved time between bladder voids to at least 2 hours for improved QOL with decreased urinary frequency.   Baseline:  Goal status: INITIAL  4. Pt to report no more than 2/10 pain for improved QOL.  Baseline:  Goal status: INITIAL   PLAN:  PT FREQUENCY: 1-2x/week  PT DURATION:  8 sessions  PLANNED INTERVENTIONS: 97110-Therapeutic exercises, 97530- Therapeutic activity, 97112- Neuromuscular re-education, 97535- Self Care, 82956- Manual therapy, [phone]- Aquatic Therapy, Patient/Family education, Taping, Dry Needling, Joint mobilization, Spinal  mobilization, Scar mobilization, Cryotherapy, Moist heat, and Biofeedback  PLAN FOR NEXT SESSION: manual for abdomen and low back, voiding mechanics, breathing mechanics, and core and hip strengthening, pelvic floor coordination   Otelia Sergeant, PT, DPT 03/17/252:00 PM

## 2024-01-03 ENCOUNTER — Ambulatory Visit: Payer: Medicaid Other | Admitting: Physical Therapy

## 2024-01-10 ENCOUNTER — Ambulatory Visit: Payer: Medicaid Other | Admitting: Physical Therapy

## 2024-01-10 DIAGNOSIS — R279 Unspecified lack of coordination: Secondary | ICD-10-CM

## 2024-01-10 DIAGNOSIS — M6281 Muscle weakness (generalized): Secondary | ICD-10-CM

## 2024-01-10 DIAGNOSIS — R293 Abnormal posture: Secondary | ICD-10-CM

## 2024-01-10 NOTE — Patient Instructions (Signed)
  15-20 mins in the evenings based on tolerance.  Please clear any positions with doctor as needed.  Please stop any position if negative symptoms occur (dizziness/lightheadedness/fatigue/increased pain, etc.)  

## 2024-01-10 NOTE — Therapy (Signed)
 OUTPATIENT PHYSICAL THERAPY FEMALE PELVIC TREATMENT   Patient Name: Sharine Cadle MRN: [id number] DOB:1986-08-27, 38 y.o., female Today's Date: 01/10/2024  END OF SESSION:  PT End of Session - 01/10/24 1233     Visit Number 4    Date for PT Re-Evaluation 02/17/24    Authorization Type [employer]    PT Start Time 1230    PT Stop Time 1311    PT Time Calculation (min) 41 min    Activity Tolerance Patient tolerated treatment well    Behavior During Therapy WFL for tasks assessed/performed               Past Medical History:  Diagnosis Date   ADHD (attention deficit hyperactivity disorder)    Anxiety    Seasonal Asthma    no current  inhaler use   Past Surgical History:  Procedure Laterality Date   BREAST ENHANCEMENT SURGERY     DILATION AND CURETTAGE OF UTERUS N/A 01/12/2022   Procedure: DILATATION AND CURETTAGE;  Surgeon: Charlett Nose, MD;  Location: [facility] [place];  Service: Gynecology;  Laterality: N/A;   DILATION AND EVACUATION  11/20/2021   @ [facility]   LIPOSUCTION     OPERATIVE ULTRASOUND N/A 01/12/2022   Procedure: OPERATIVE ULTRASOUND;  Surgeon: Charlett Nose, MD;  Location: [facility] [place];  Service: Gynecology;  Laterality: N/A;   Patient Active Problem List   Diagnosis Date Noted   Chronic migraine w/o aura w/o status migrainosus, not intractable 12/16/2023   Attention deficit hyperactivity disorder (ADHD) 09/20/2023   Anxiety 09/20/2023   Speech disturbance 01/28/2022   Mild cognitive impairment 11/04/2021   Normal labor 05/07/2021   Spontaneous vaginal delivery 05/07/2021    PCP: Allwardt, Crist Infante, PA-C   REFERRING PROVIDER: Allwardt, Crist Infante, PA-C   REFERRING DIAG: R35.0 (ICD-10-CM) - Urinary frequency R33.9 (ICD-10-CM) - Incomplete bladder emptying  THERAPY DIAG:  Muscle weakness (generalized)  Unspecified lack of coordination  Abnormal posture  Rationale for Evaluation and Treatment:  Rehabilitation  ONSET DATE: chronic  SUBJECTIVE:                                                                                                                                                                                           SUBJECTIVE STATEMENT: Pt reports she has had leakage with urgency, does still have increased frequency of urine. Reports when at home does urge drill does work but when out gets more stressed and has to go. Does do a lot  of "just in case pees". Pressure is getting better vaginally now that she understands how to do exercises.   Fluid intake:  Yes: water - 5 12 oz daily or a little more; coffee at least 2 cups daily    PAIN:  01/10/24 no Are you having pain? Yes NPRS scale: 4/10 Pain location:  low back  Pain type: aching Pain description: intermittent   Aggravating factors: forward being for more than several minutes, laying too long Relieving factors: goes away on its own  PRECAUTIONS: None  RED FLAGS: None   WEIGHT BEARING RESTRICTIONS: No  FALLS:  Has patient fallen in last 6 months? No  LIVING ENVIRONMENT: Lives with: lives with their family Lives in: House/apartment   OCCUPATION: not currently   PLOF: Independent  PATIENT GOALS: to have less frequency and leakage of urine  PERTINENT HISTORY:  ADHD, anxiety, x2 vaginal births, breast enhancement Sexual abuse: No  BOWEL MOVEMENT: Pain with bowel movement: No Type of bowel movement:Type (Bristol Stool Scale) 4, Frequency 2x daily, and Strain No Fully empty rectum: Yes:   Leakage: No Pads: No Fiber supplement: No  URINATION: Pain with urination: No Fully empty bladder: No Stream: Strong and Weak Urgency: Yes:   Frequency: always feels she has to pee, will push a lot during urination to try to empty but feels like never fully empty Leakage: Urge to void, Walking to the bathroom, Coughing, Sneezing, and Laughing Pads: No  INTERCOURSE: Pain with intercourse: Initial  Penetration and During Penetration Ability to have vaginal penetration:  Yes: denies dryness  Climax: not painful Marinoff Scale: 0/3  PREGNANCY: Vaginal deliveries 2 Tearing Yes: did have tearing with first C-section deliveries 0 Currently pregnant No  PROLAPSE: Heaviness felt at vagina and rectum with bowel movements   OBJECTIVE:  Note: Objective measures were completed at Evaluation unless otherwise noted.  DIAGNOSTIC FINDINGS:  Previous urology evaluation revealed incomplete bladder emptying.   PATIENT SURVEYS:    PFIQ-7 90  COGNITION: Overall cognitive status: Within functional limits for tasks assessed     SENSATION: Light touch: Appears intact Proprioception: Appears intact  MUSCLE LENGTH: Bil hamstrings and adductors limited by 50%  POSTURE: rounded shoulders  PELVIC ALIGNMENT: WFL  LUMBARAROM/PROM:  A/PROM A/PROM  eval  Flexion Limited by 25%  Extension WFL  Right lateral flexion Limited by 50%  Left lateral flexion Limited by 50%  Right rotation Limited by 25%  Left rotation Limited by 25%   (Blank rows = not tested)  LOWER EXTREMITY ROM:  WFL  LOWER EXTREMITY MMT:  Bil hips grossly 3+/5; knees 4+/5  PALPATION:   General  tightness at lumbar and thoracic paraspinals, piriformis, and fascial restrictions in mid and lower abdominal quadrants                External Perineal Exam no TTP, [facility]                             Internal Pelvic Floor tension noted in superficial and bil obturator internus and TTP in obturators and throughout superficial layers.   Patient confirms identification and approves PT to assess internal pelvic floor and treatment Yes No emotional/communication barriers or cognitive limitation. Patient is motivated to learn. Patient understands and agrees with treatment goals and plan. PT explains patient will be examined in standing, sitting, and lying down to see how their muscles and joints work. When they are ready, they  will be asked to remove their underwear so PT can examine their perineum. The patient is also given the option of providing their own chaperone as one  is not provided in our facility. The patient also has the right and is explained the right to defer or refuse any part of the evaluation or treatment including the internal exam. With the patient's consent, PT will use one gloved finger to gently assess the muscles of the pelvic floor, seeing how well it contracts and relaxes and if there is muscle symmetry. After, the patient will get dressed and PT and patient will discuss exam findings and plan of care. PT and patient discuss plan of care, schedule, attendance policy and HEP activities.  PELVIC MMT:   MMT eval  Vaginal 4/5, 10s, 7 reps  Internal Anal Sphincter   External Anal Sphincter   Puborectalis   Diastasis Recti   (Blank rows = not tested)        TONE: WFL  PROLAPSE: Mild anterior vaginal wall descent with cough in hooklying but minimal  TODAY'S TREATMENT:                                                                                                                              DATE:    12/27/23: Pt reports she sees a bulge at vagina with pushing for bowel movement and wanted to check this today. Pt does continue to demonstrate possible grade 1 anterior vaginal wall laxity with cough but when cued to bring knees up to push "as if she was pushing for bowel movement." Pt does demonstrate worsening of laxity to possibly grade 2 however with reassessing pelvic floor contractions pt demonstrated good techniques and strength but endurance around 6s without compensatory strategies. And pt educated on importance of not straining with bowel movement and to attempt balloon breathing instead. Pt demonstrated improvement with this and used long arm mirror to improve visual feedback of laxity and pt able to improve this.  2x10 pelvic floor contractions, x10 6-7s isometrics, x10 quick flicks with pt  overall demonstrating no downward strain at all with pelvic floor contractions only with straining.   01/10/24  Butterfly 3x30s Single knee to chest 3x30s each Happy baby 3x30s Lumbar rotation x10 each  Needed to do urge drill due to urgency after emptying bladder 2x at start of session, pelvic propping also relieves urgency Cat/cow 2x10 Childs pose 3x30s Educated on pelvic relief positions, bladder diary   PATIENT EDUCATION:  Education details: [id number] Person educated: Patient Education method: Programmer, multimedia, Facilities manager, Actor cues, Verbal cues, and Handouts Education comprehension: verbalized understanding, returned demonstration, verbal cues required, tactile cues required, and needs further education  HOME EXERCISE PROGRAM: [phone]  ASSESSMENT:  CLINICAL IMPRESSION: Patient presents for treatment, focus on education for bladder diary and pelvic relief positions handouts given and reviewed and pt denied questions. Pt session then focused on relaxation at abdomen and pelvic floor and low back with good results, pt reported greatly decreased urge to urinate with pelvic propped and cues for relaxation. Pt tolerated well but did need to urinate x3 during session and urge drill x3  as well.  Pt would benefit from additional PT to further address deficits.    OBJECTIVE IMPAIRMENTS: decreased activity tolerance, decreased coordination, decreased endurance, decreased mobility, decreased strength, increased fascial restrictions, impaired flexibility, improper body mechanics, and pain.   ACTIVITY LIMITATIONS: lifting, sitting, standing, squatting, and continence  PARTICIPATION LIMITATIONS: interpersonal relationship and community activity  PERSONAL FACTORS: Time since onset of injury/illness/exacerbation and 1 comorbidity: x2 vaginal births  are also affecting patient's functional outcome.   REHAB POTENTIAL: Good  CLINICAL DECISION MAKING: Stable/uncomplicated  EVALUATION  COMPLEXITY: Low   GOALS: Goals reviewed with patient? Yes  SHORT TERM GOALS: Target date: 11/17/23  Pt to be I with HEP.  Baseline: Goal status: MET  2.  Pt will be able to go.5 to 1 hours in between voids without urgency or incontinence in order to improve QOL and perform all functional activities with less difficulty.   Baseline:  Goal status: on going  3.  Pt to be I with urge drill for decreased urinary frequency.  Baseline:  Goal status: MET  4.  Pt to be I with relaxation techniques for decreased tension at abdomen and pelvic floor. Baseline:  Goal status: on going   LONG TERM GOALS: Target date: 02/17/24  Pt to be I with advanced HEP.  Baseline:  Goal status: INITIAL  2.  Pt will have 50% less urgency due to bladder retraining and strengthening  Baseline:  Goal status: INITIAL  3.  Pt to report improved time between bladder voids to at least 2 hours for improved QOL with decreased urinary frequency.   Baseline:  Goal status: INITIAL  4. Pt to report no more than 2/10 pain for improved QOL.  Baseline:  Goal status: INITIAL   PLAN:  PT FREQUENCY: 1-2x/week  PT DURATION:  8 sessions  PLANNED INTERVENTIONS: 97110-Therapeutic exercises, 97530- Therapeutic activity, 97112- Neuromuscular re-education, 97535- Self Care, [id number]- Manual therapy, [phone]- Aquatic Therapy, Patient/Family education, Taping, Dry Needling, Joint mobilization, Spinal mobilization, Scar mobilization, Cryotherapy, Moist heat, and Biofeedback  PLAN FOR NEXT SESSION: manual for abdomen and low back, voiding mechanics, breathing mechanics, and core and hip strengthening, pelvic floor coordination   Otelia Sergeant, PT, DPT 03/31/251:54 PM

## 2024-01-14 ENCOUNTER — Ambulatory Visit (INDEPENDENT_AMBULATORY_CARE_PROVIDER_SITE_OTHER): Admitting: Physician Assistant

## 2024-01-14 ENCOUNTER — Encounter: Payer: Self-pay | Admitting: Physician Assistant

## 2024-01-14 VITALS — BP 120/78 | HR 76 | Temp 98.0°F | Ht 63.0 in | Wt 167.3 lb

## 2024-01-14 DIAGNOSIS — N3281 Overactive bladder: Secondary | ICD-10-CM

## 2024-01-14 MED ORDER — OXYBUTYNIN CHLORIDE ER 5 MG PO TB24: 5.0000 mg | ORAL_TABLET | Freq: Every day | ORAL | 2 refills | Status: AC

## 2024-01-14 NOTE — Progress Notes (Signed)
 Patient ID: Claudia Sharp, female    DOB: [birth year], 38 y.o.   MRN: [id number]   Assessment & Plan:  Overactive bladder -     Ambulatory referral to Urogynecology  Other orders -     oxyBUTYnin Chloride ER; Take 1 tablet (5 mg total) by mouth at bedtime.  Dispense: 30 tablet; Refill: 2      Assessment and Plan Assessment & Plan Overactive Bladder She reports persistent urinary frequency, urgency, and incontinence despite five weeks of pelvic floor physical therapy. She previously benefited from overactive bladder medication but cannot recall the specific medication. Oxybutynin, an anticholinergic, was discussed as a treatment option. It may alleviate symptoms but can cause dryness as a side effect. She is willing to try oxybutynin, starting with a low dose, and is aware of the potential side effects. The decision is based on its potential effectiveness and insurance coverage considerations. - Prescribe oxybutynin extended-release 5 mg once daily. - Instruct her to increase dose if needed and contact the provider for further adjustments. - Continue pelvic floor PT  - Refer to urogynecology for further evaluation.       Return in about 3 months (around 04/14/2024) for recheck/follow-up.    Subjective:    Chief Complaint  Patient presents with   bladder     States the pelvic therapy she had done did not help with her bladder would like to try medication.    HPI Discussed the use of AI scribe software for clinical note transcription with the patient, who gave verbal consent to proceed.  History of Present Illness Claudia Sharp is a 38 year old female who presents with urinary frequency and urgency. She is accompanied by her two-year-old daughter.  She experiences persistent urinary frequency, urgency, and leakage, significantly impacting her daily activities. She needs to urinate every half an hour, which disrupts her routine. No burning sensation during urination is  noted, and her symptoms have remained consistent since her last visit.  She has undergone five weeks of pelvic floor physical therapy but feels it has not been effective in alleviating her symptoms.  She has a history of urinary retention and previously consulted urology, where incomplete voiding was noted. She recalls taking medication for overactive bladder in the past while living in [place], which was effective, but discontinued it due to lack of insurance. She does not remember the name of the medication.     Past Medical History:  Diagnosis Date   ADHD (attention deficit hyperactivity disorder)    Anxiety    Seasonal Asthma    no current  inhaler use    Past Surgical History:  Procedure Laterality Date   BREAST ENHANCEMENT SURGERY     DILATION AND CURETTAGE OF UTERUS N/A 01/12/2022   Procedure: DILATATION AND CURETTAGE;  Surgeon: Charlett Nose, MD;  Location: [facility];  Service: Gynecology;  Laterality: N/A;   DILATION AND EVACUATION  11/20/2021   @ [facility]   LIPOSUCTION     OPERATIVE ULTRASOUND N/A 01/12/2022   Procedure: OPERATIVE ULTRASOUND;  Surgeon: Charlett Nose, MD;  Location: [facility];  Service: Gynecology;  Laterality: N/A;    Family History  Problem Relation Age of Onset   Asthma Mother     Social History   Tobacco Use   Smoking status: Never    Passive exposure: Never   Smokeless tobacco: Never  Vaping Use   Vaping status: Every Day   Substances: Nicotine  Substance Use  Topics   Alcohol use: Not Currently    Comment: very rare   Drug use: Never     No Known Allergies  Review of Systems NEGATIVE UNLESS OTHERWISE INDICATED IN HPI      Objective:     BP 120/78   Pulse 76   Temp 98 F (36.7 C) (Temporal)   Ht 5\' 3"  (1.6 m)   Wt 167 lb 4.8 oz (75.9 kg)   LMP 01/07/2024 (Approximate)   SpO2 98%   BMI 29.64 kg/m   Wt Readings from Last 3 Encounters:  01/14/24 167 lb 4.8 oz (75.9 kg)   12/16/23 164 lb 6.4 oz (74.6 kg)  09/20/23 157 lb 6.4 oz (71.4 kg)    BP Readings from Last 3 Encounters:  01/14/24 120/78  12/16/23 125/86  09/20/23 118/85     Physical Exam Vitals and nursing note reviewed.  Constitutional:      Appearance: Normal appearance.  Cardiovascular:     Rate and Rhythm: Normal rate and regular rhythm.  Pulmonary:     Effort: Pulmonary effort is normal.     Breath sounds: Normal breath sounds.  Neurological:     General: No focal deficit present.     Mental Status: She is alert and oriented to person, place, and time.  Psychiatric:        Mood and Affect: Mood normal.        Behavior: Behavior normal.             Loyal Holzheimer M Iram Astorino, PA-C

## 2024-01-17 ENCOUNTER — Ambulatory Visit: Payer: Medicaid Other | Attending: Physician Assistant | Admitting: Physical Therapy

## 2024-01-17 DIAGNOSIS — R279 Unspecified lack of coordination: Secondary | ICD-10-CM | POA: Insufficient documentation

## 2024-01-17 DIAGNOSIS — R293 Abnormal posture: Secondary | ICD-10-CM | POA: Insufficient documentation

## 2024-01-17 DIAGNOSIS — M6281 Muscle weakness (generalized): Secondary | ICD-10-CM | POA: Insufficient documentation

## 2024-01-25 ENCOUNTER — Ambulatory Visit: Payer: Medicaid Other | Admitting: Physical Therapy

## 2024-01-31 ENCOUNTER — Encounter: Payer: Medicaid Other | Admitting: Physical Therapy

## 2024-02-07 ENCOUNTER — Encounter: Payer: Medicaid Other | Admitting: Physical Therapy

## 2024-02-14 ENCOUNTER — Encounter: Payer: Medicaid Other | Admitting: Physical Therapy

## 2024-07-02 ENCOUNTER — Telehealth: Admitting: Family

## 2024-07-02 DIAGNOSIS — B9689 Other specified bacterial agents as the cause of diseases classified elsewhere: Secondary | ICD-10-CM | POA: Diagnosis not present

## 2024-07-02 DIAGNOSIS — J208 Acute bronchitis due to other specified organisms: Secondary | ICD-10-CM

## 2024-07-02 MED ORDER — BENZONATATE 200 MG PO CAPS: 200.0000 mg | ORAL_CAPSULE | Freq: Two times a day (BID) | ORAL | 0 refills | Status: DC | PRN

## 2024-07-02 MED ORDER — DOXYCYCLINE HYCLATE 100 MG PO TABS: 100.0000 mg | ORAL_TABLET | Freq: Two times a day (BID) | ORAL | 0 refills | Status: DC

## 2024-07-02 MED ORDER — PREDNISONE 10 MG (21) PO TBPK: ORAL_TABLET | ORAL | 0 refills | Status: DC

## 2024-07-02 NOTE — Progress Notes (Signed)
 E-Visit for Cough   We are sorry that you are not feeling well.  Here is how we plan to help!  Based on your presentation I believe you most likely have A cough due to bacteria.  When patients have a fever and a productive cough with a change in color or increased sputum production, we are concerned about bacterial bronchitis.  If left untreated it can progress to pneumonia.  If your symptoms do not improve with your treatment plan it is important that you contact your provider.   I have prescribed Doxycycline  100 mg twice a day for 10 days     In addition you may use A non-prescription cough medication called Robitussin DAC. Take 2 teaspoons every 8 hours or Delsym: take 2 teaspoons every 12 hours., A non-prescription cough medication called Mucinex DM: take 2 tablets every 12 hours., and A prescription cough medication called Tessalon  Perles 100mg . You may take 1-2 capsules every 8 hours as needed for your cough.  Prednisone  10 mg daily for 6 days (see taper instructions below)  Directions for 6 day taper: Day 1: 2 tablets before breakfast, 1 after both lunch & dinner and 2 at bedtime Day 2: 1 tab before breakfast, 1 after both lunch & dinner and 2 at bedtime Day 3: 1 tab at each meal & 1 at bedtime Day 4: 1 tab at breakfast, 1 at lunch, 1 at bedtime Day 5: 1 tab at breakfast & 1 tab at bedtime Day 6: 1 tab at breakfast  From your responses in the eVisit questionnaire you describe inflammation in the upper respiratory tract which is causing a significant cough.  This is commonly called Bronchitis and has four common causes:   Allergies Viral Infections Acid Reflux Bacterial Infection Allergies, viruses and acid reflux are treated by controlling symptoms or eliminating the cause. An example might be a cough caused by taking certain blood pressure medications. You stop the cough by changing the medication. Another example might be a cough caused by acid reflux. Controlling the reflux helps  control the cough.  USE OF BRONCHODILATOR (RESCUE) INHALERS: There is a risk from using your bronchodilator too frequently.  The risk is that over-reliance on a medication which only relaxes the muscles surrounding the breathing tubes can reduce the effectiveness of medications prescribed to reduce swelling and congestion of the tubes themselves.  Although you feel brief relief from the bronchodilator inhaler, your asthma may actually be worsening with the tubes becoming more swollen and filled with mucus.  This can delay other crucial treatments, such as oral steroid medications. If you need to use a bronchodilator inhaler daily, several times per day, you should discuss this with your provider.  There are probably better treatments that could be used to keep your asthma under control.     HOME CARE Only take medications as instructed by your medical team. Complete the entire course of an antibiotic. Drink plenty of fluids and get plenty of rest. Avoid close contacts especially the very young and the elderly Cover your mouth if you cough or cough into your sleeve. Always remember to wash your hands A steam or ultrasonic humidifier can help congestion.   GET HELP RIGHT AWAY IF: You develop worsening fever. You become short of breath You cough up blood. Your symptoms persist after you have completed your treatment plan MAKE SURE YOU  Understand these instructions. Will watch your condition. Will get help right away if you are not doing well or get worse.  Thank you for choosing an e-visit.  Your e-visit answers were reviewed by a board certified advanced clinical practitioner to complete your personal care plan. Depending upon the condition, your plan could have included both over the counter or prescription medications.  Please review your pharmacy choice. Make sure the pharmacy is open so you can pick up prescription now. If there is a problem, you may contact your provider through  [employer] and have the prescription routed to another pharmacy.  Your safety is important to us . If you have drug allergies check your prescription carefully.   For the next 24 hours you can use MyChart to ask questions about today's visit, request a non-urgent call back, or ask for a work or school excuse. You will get an email in the next two days asking about your experience. I hope that your e-visit has been valuable and will speed your recovery.

## 2024-07-02 NOTE — Progress Notes (Signed)
Approximately 5 minutes was spent documenting and reviewing patient's chart.

## 2024-07-05 ENCOUNTER — Ambulatory Visit: Payer: Self-pay

## 2024-07-05 NOTE — Telephone Encounter (Signed)
 Noted

## 2024-07-05 NOTE — Telephone Encounter (Signed)
 FYI Only or Action Required?: FYI only for provider.  Patient was last seen in primary care on 01/14/2024 by Allwardt, Mardy HERO, PA-C.  Called Nurse Triage reporting Cough with wheezing  Symptoms began a week ago.  Interventions attempted: Prescription medications: Doxycycline  and Prednisone  with no improvement.  Symptoms are: gradually worsening.  Triage Disposition: See HCP Within 4 Hours (Or PCP Triage)  Patient/caregiver understands and will follow disposition?: Yes     Copied from CRM [id number]. Topic: Clinical - Red Word Triage >> Jul 05, 2024  9:16 AM Pinkey ORN wrote: Red Word that prompted transfer to Nurse Triage: Lingering Cough + Green Mucus >> Jul 05, 2024  9:23 AM Pinkey ORN wrote: Patient is seeking to establish care with new provider in [facility] since she has recently moved to [place] area.  Reason for Disposition  Wheezing is present    Patient can't be seen today, is on abx and prednisone   Answer Assessment - Initial Assessment Questions Patient did virtual appt on 07/02/24 and received Doxycycline  and Prednisone . Patient is prone to lung infections.    1. ONSET: When did the cough begin?      A little over a week ago 2. SEVERITY: How bad is the cough today?      Patient states she is coughing bad to the point of losing breath 3. SPUTUM: Describe the color of your sputum (e.g., none, dry cough; clear, white, yellow, green)     Green  4. HEMOPTYSIS: Are you coughing up any blood? If Yes, ask: How much? (e.g., flecks, streaks, tablespoons, etc.)     No 5. DIFFICULTY BREATHING: Are you having difficulty breathing? If Yes, ask: How bad is it? (e.g., mild, moderate, severe)      Coughs a lot to where she loses breath but was given Abx and Prednisone  6. FEVER: Do you have a fever? If Yes, ask: What is your temperature, how was it measured, and when did it start?     No 7. CARDIAC HISTORY: Do you have any history of heart disease?  (e.g., heart attack, congestive heart failure)      No 8. LUNG HISTORY: Do you have any history of lung disease?  (e.g., pulmonary embolus, asthma, emphysema)     Has hx of lung infections, asthma during allergy season she uses inhaler 9. PE RISK FACTORS: Do you have a history of blood clots? (or: recent major surgery, recent prolonged travel, bedridden)     No 10. OTHER SYMPTOMS: Do you have any other symptoms? (e.g., runny nose, wheezing, chest pain)       Wheezing, slight chest pain/back pain  Protocols used: Cough - Acute Productive-A-AH

## 2024-07-06 ENCOUNTER — Ambulatory Visit: Admitting: Family

## 2024-07-14 ENCOUNTER — Other Ambulatory Visit: Payer: Self-pay | Admitting: Medical Genetics

## 2024-08-01 ENCOUNTER — Ambulatory Visit

## 2024-08-01 VITALS — BP 112/84 | HR 92 | Resp 16 | Ht 62.0 in | Wt 162.0 lb

## 2024-08-01 DIAGNOSIS — J4520 Mild intermittent asthma, uncomplicated: Secondary | ICD-10-CM | POA: Diagnosis not present

## 2024-08-01 DIAGNOSIS — G4700 Insomnia, unspecified: Secondary | ICD-10-CM | POA: Insufficient documentation

## 2024-08-01 DIAGNOSIS — J309 Allergic rhinitis, unspecified: Secondary | ICD-10-CM | POA: Insufficient documentation

## 2024-08-01 DIAGNOSIS — J3089 Other allergic rhinitis: Secondary | ICD-10-CM

## 2024-08-01 MED ORDER — ALBUTEROL SULFATE HFA 108 (90 BASE) MCG/ACT IN AERS: 2.0000 | INHALATION_SPRAY | Freq: Four times a day (QID) | RESPIRATORY_TRACT | 2 refills | Status: DC | PRN

## 2024-08-01 MED ORDER — FLUTICASONE PROPIONATE 50 MCG/ACT NA SUSP: 2.0000 | Freq: Every day | NASAL | 3 refills | Status: AC

## 2024-08-01 MED ORDER — ALBUTEROL SULFATE HFA 108 (90 BASE) MCG/ACT IN AERS
2.0000 | INHALATION_SPRAY | Freq: Four times a day (QID) | RESPIRATORY_TRACT | 2 refills | Status: AC | PRN
Start: 2024-08-01 — End: ?

## 2024-08-01 MED ORDER — FLUTICASONE PROPIONATE 50 MCG/ACT NA SUSP: 2.0000 | Freq: Every day | NASAL | 0 refills | Status: DC

## 2024-08-01 NOTE — Assessment & Plan Note (Addendum)
 Chronic, uncontrolled since running out of albuterol  inhaler. No wheezing on exam today. Had a recent exacerbation about 1 month ago and took course of prednisone  and doxycycline  with some improvement.  - Will refill albuterol  to take PRN - Consider starting Symbicort if symptoms persist - Recommend patient cut back on vaping

## 2024-08-01 NOTE — Assessment & Plan Note (Signed)
 Chronic, uncontrolled. Patient has tried Zyrtec, claritin, and Xyzal without much improvement. Flonase  has helped in the past. Interested in seeing allergist. Will place referral. Refilled Flonase .

## 2024-08-01 NOTE — Progress Notes (Signed)
 New patient visit   Patient: Claudia Sharp   DOB: 21-Mar-1986   38 y.o. Female  MRN: [id number] Visit Date: 08/01/2024  Today's healthcare provider: Isaiah DELENA Pepper, MD   Chief Complaint  Patient presents with   Transitions Of Care    TOC/ Est Care/Coughing/ whezzing    Subjective    Claudia Sharp is a 38 y.o. female who presents today as a new patient to establish care.   Asthma - Reports some mild coughing and wheezing for about 1 month, has been out of albuterol  inhaler  ADHD - takes adderall, prescribed by psychiatrist  Allergies - reports year-round allergies. Has tried zyrtec, claritin, and xyzal without much improvement. Takes Flonase  which provides some relief.  Past Medical History:  Diagnosis Date   ADHD (attention deficit hyperactivity disorder)    Anxiety    Seasonal Asthma    no current  inhaler use   Past Surgical History:  Procedure Laterality Date   BREAST ENHANCEMENT SURGERY     DILATION AND CURETTAGE OF UTERUS N/A 01/12/2022   Procedure: DILATATION AND CURETTAGE;  Surgeon: Diedre Rosaline BRAVO, MD;  Location: [facility];  Service: Gynecology;  Laterality: N/A;   DILATION AND EVACUATION  11/20/2021   @ [facility]   LIPOSUCTION     OPERATIVE ULTRASOUND N/A 01/12/2022   Procedure: OPERATIVE ULTRASOUND;  Surgeon: Diedre Rosaline BRAVO, MD;  Location: [facility] ;  Service: Gynecology;  Laterality: N/A;   Family Status  Relation Name Status   Mother  Alive   Father  Alive  No partnership data on file   Family History  Problem Relation Age of Onset   Asthma Mother    Social History   Socioeconomic History   Marital status: Single    Spouse name: Not on file   Number of children: 2   Years of education: Not on file   Highest education level: 9th grade  Occupational History   Occupation: stay at home  Tobacco Use   Smoking status: Never    Passive exposure: Never   Smokeless tobacco: Never  Vaping Use    Vaping status: Every Day   Substances: Nicotine  Substance and Sexual Activity   Alcohol use: Not Currently    Comment: very rare   Drug use: Never   Sexual activity: Yes    Birth control/protection: Implant  Other Topics Concern   Not on file  Social History Narrative   Not on file   Social Drivers of Health   Financial Resource Strain: Patient Declined (08/01/2024)   Overall Financial Resource Strain (CARDIA)    Difficulty of Paying Living Expenses: Patient declined  Food Insecurity: Patient Declined (08/01/2024)   Hunger Vital Sign    Worried About Running Out of Food in the Last Year: Patient declined    Ran Out of Food in the Last Year: Patient declined  Transportation Needs: No Transportation Needs (08/01/2024)   PRAPARE - Administrator, Civil Service (Medical): No    Lack of Transportation (Non-Medical): No  Physical Activity: Not on file  Stress: Not on file  Social Connections: Socially Isolated (08/01/2024)   Social Connection and Isolation Panel    Frequency of Communication with Friends and Family: More than three times a week    Frequency of Social Gatherings with Friends and Family: More than three times a week    Attends Religious Services: Patient declined    Database administrator or Organizations: No  Attends Banker Meetings: Not on file    Marital Status: Never married   Outpatient Medications Prior to Visit  Medication Sig   amphetamine-dextroamphetamine (ADDERALL) 15 MG tablet Take 30 mg by mouth daily.   DULoxetine (CYMBALTA) 60 MG capsule Take 60 mg by mouth 2 (two) times daily.   etonogestrel (NEXPLANON) 68 MG IMPL implant 1 each by Subdermal route once.   SUMAtriptan  (IMITREX ) 50 MG tablet Take 1 tablet (50 mg total) by mouth every 2 (two) hours as needed for migraine. May repeat in 2 hours if headache persists or recurs.   traZODone (DESYREL) 50 MG tablet Take 25 mg by mouth at bedtime.   tretinoin (RETIN-A) 0.025 %  cream Apply topically at bedtime.   triamcinolone  cream (KENALOG ) 0.1 % Apply 1 Application topically 2 (two) times daily. Apply thin layer to affected areas no longer than 2 weeks consistently then sparingly.   [DISCONTINUED] albuterol  (PROAIR  HFA) 108 (90 Base) MCG/ACT inhaler Inhale 2 puffs into the lungs every 6 (six) hours as needed for wheezing or shortness of breath.   [DISCONTINUED] benzonatate  (TESSALON ) 200 MG capsule Take 1 capsule (200 mg total) by mouth 2 (two) times daily as needed for cough.   [DISCONTINUED] doxycycline  (VIBRA -TABS) 100 MG tablet Take 1 tablet (100 mg total) by mouth 2 (two) times daily.   [DISCONTINUED] fluticasone  (FLONASE ) 50 MCG/ACT nasal spray Place 2 sprays into both nostrils daily.   [DISCONTINUED] predniSONE  (STERAPRED UNI-PAK 21 TAB) 10 MG (21) TBPK tablet Use as directed (Patient not taking: Reported on 08/01/2024)   [DISCONTINUED] Prenatal Vit-Fe Fumarate-FA (PRENATAL MULTIVITAMIN) TABS tablet Take 1 tablet by mouth daily at 12 noon. (Patient not taking: Reported on 01/14/2024)   No facility-administered medications prior to visit.   No Known Allergies  Reviews of Systems as noted in HPI.      Objective    BP 112/84 (BP Location: Left Arm, Patient Position: Sitting, Cuff Size: Normal)   Pulse 92   Resp 16   Ht 5' 2 (1.575 m)   Wt 162 lb (73.5 kg)   SpO2 100%   BMI 29.63 kg/m     Physical Exam Constitutional:      Appearance: Normal appearance.  HENT:     Head: Normocephalic and atraumatic.     Mouth/Throat:     Mouth: Mucous membranes are moist.  Eyes:     Pupils: Pupils are equal, round, and reactive to light.  Cardiovascular:     Rate and Rhythm: Normal rate and regular rhythm.     Heart sounds: Normal heart sounds.  Pulmonary:     Effort: Pulmonary effort is normal. No respiratory distress.     Breath sounds: Normal breath sounds. No wheezing.  Skin:    General: Skin is warm.  Neurological:     General: No focal deficit  present.     Mental Status: She is alert.     Depression Screen    08/01/2024    3:07 PM  PHQ 2/9 Scores  PHQ - 2 Score 1  PHQ- 9 Score 6   No results found for any visits on 08/01/24.  Assessment & Plan      Problem List Items Addressed This Visit       Respiratory   Asthma - Primary   Chronic, uncontrolled since running out of albuterol  inhaler. No wheezing on exam today. Had a recent exacerbation about 1 month ago and took course of prednisone  and doxycycline  with some improvement.  - Will  refill albuterol  to take PRN - Consider starting Symbicort if symptoms persist - Recommend patient cut back on vaping      Relevant Medications   albuterol  (PROAIR  HFA) 108 (90 Base) MCG/ACT inhaler   Other Relevant Orders   Pneumococcal conjugate vaccine 20-valent (Completed)   Allergic rhinitis due to allergen   Chronic, uncontrolled. Patient has tried Zyrtec, claritin, and Xyzal without much improvement. Flonase  has helped in the past. Interested in seeing allergist. Will place referral. Refilled Flonase .      Relevant Medications   fluticasone  (FLONASE ) 50 MCG/ACT nasal spray   Other Relevant Orders   Ambulatory referral to Allergy    Return in about 1 year (around 08/01/2025) for Annual Physical Exam.      Isaiah DELENA Pepper, MD  [facility] [phone] (phone) [phone] (fax)

## 2024-08-02 ENCOUNTER — Other Ambulatory Visit

## 2024-08-07 ENCOUNTER — Other Ambulatory Visit
Admission: RE | Admit: 2024-08-07 | Discharge: 2024-08-07 | Disposition: A | Discharge status: Home / Self Care | Payer: Self-pay | Source: Ambulatory Visit | Attending: Medical Genetics | Admitting: Medical Genetics

## 2024-08-15 LAB — GENECONNECT MOLECULAR SCREEN: Genetic Analysis Overall Interpretation: NEGATIVE

## 2024-08-31 ENCOUNTER — Ambulatory Visit

## 2024-08-31 VITALS — BP 121/83 | HR 101 | Temp 98.5°F | Resp 16 | Ht 62.0 in | Wt 157.7 lb

## 2024-08-31 DIAGNOSIS — G5601 Carpal tunnel syndrome, right upper limb: Secondary | ICD-10-CM

## 2024-08-31 DIAGNOSIS — J4541 Moderate persistent asthma with (acute) exacerbation: Secondary | ICD-10-CM

## 2024-08-31 LAB — POCT INFLUENZA A/B
Influenza A, POC: NEGATIVE
Influenza B, POC: NEGATIVE

## 2024-08-31 LAB — POCT RAPID STREP A (OFFICE): Rapid Strep A Screen: NEGATIVE

## 2024-08-31 LAB — POC COVID19 BINAXNOW: SARS Coronavirus 2 Ag: NEGATIVE

## 2024-08-31 MED ORDER — PREDNISONE 20 MG PO TABS: 40.0000 mg | ORAL_TABLET | Freq: Every day | ORAL | 0 refills | Status: AC

## 2024-08-31 NOTE — Progress Notes (Signed)
 Acute visit   Patient: Claudia Sharp   DOB: May 08, 1986   38 y.o. Female  MRN: [id number] PCP: Franchot Isaiah LABOR, MD   Chief Complaint  Patient presents with   Sore Throat    Sinus, headache, fatigue, sore throat onset 3 days no at home test   Numbness    Pt reports since receiving pneumonia vaccines has been having severe numbness, tingling R arm of injection site. 08/01/24    Subjective    Discussed the use of AI scribe software for clinical note transcription with the patient, who gave verbal consent to proceed.  History of Present Illness Claudia Sharp is a 38 year old female who presents with numbness in her arm and hand.  She experiences numbness in her hand and entire arm, which began after receiving a pneumonia shot. The numbness has persisted for about a month and is particularly bothersome at night, waking her from sleep with a 'pins and needles' sensation. The numbness starts from the arm and extends to the fingertips.   She has a history of asthma and reports wheezing and chest pressure, which prompted her visit. She experiences chills but no fever, and has a runny nose without significant sinus pressure. She uses an albuterol  inhaler as needed, most recently yesterday, but not on a daily basis.  She mentions a history of smoking, having quit approximately fifteen years ago, and notes that she is susceptible to respiratory infections due to damage from past smoking. Her mother has asthma and carpal tunnel syndrome, which she wonders might be related to her current symptoms.  Review of systems as noted in HPI.   Objective    BP 121/83   Pulse (!) 101   Temp 98.5 F (36.9 C) (Oral)   Resp 16   Ht 5' 2 (1.575 m)   Wt 157 lb 11.2 oz (71.5 kg)   LMP 08/24/2024   SpO2 99%   BMI 28.84 kg/m  Physical Exam Constitutional:      Appearance: Normal appearance.  HENT:     Head: Normocephalic and atraumatic.     Right Ear: Tympanic membrane, ear canal  and external ear normal.     Left Ear: Tympanic membrane, ear canal and external ear normal.     Nose: Congestion and rhinorrhea present.     Mouth/Throat:     Mouth: Mucous membranes are moist.     Pharynx: Posterior oropharyngeal erythema present. No oropharyngeal exudate.  Eyes:     Pupils: Pupils are equal, round, and reactive to light.  Cardiovascular:     Rate and Rhythm: Normal rate and regular rhythm.     Heart sounds: Normal heart sounds.  Pulmonary:     Effort: Pulmonary effort is normal.     Breath sounds: Wheezing present.  Musculoskeletal:     Right wrist: No bony tenderness. Normal range of motion.     Comments: Strength 5/5 in R arm. + Phalen, - Tinel  Skin:    General: Skin is warm.  Neurological:     General: No focal deficit present.     Mental Status: She is alert.       Results for orders placed or performed in visit on 08/31/24  POCT rapid strep A  Result Value Ref Range   Rapid Strep A Screen Negative Negative  POC COVID-19  Result Value Ref Range   SARS Coronavirus 2 Ag Negative Negative  POCT Influenza A/B  Result Value Ref Range   Influenza  A, POC Negative Negative   Influenza B, POC Negative Negative    Assessment & Plan     Problem List Items Addressed This Visit       Respiratory   Moderate persistent asthma with exacerbation - Primary   Relevant Medications   predniSONE  (DELTASONE ) 20 MG tablet   Other Relevant Orders   POCT rapid strep A (Completed)   POC COVID-19 (Completed)   POCT Influenza A/B (Completed)     Nervous and Auditory   Carpal tunnel syndrome of right wrist   Assessment & Plan Moderate persistent asthma with acute exacerbation Acute exacerbation likely due to viral infection. Negative for flu, strep, and COVID. Wheezing present on exam. No fever, but chills and runny nose noted. - Prescribed prednisone  40 mg daily for 5 days. - Advised using albuterol  inhaler every 4 hours as needed for wheezing. - Consider  starting Symbicort - Instructed to monitor symptoms and report if condition worsens or does not improve with treatment.  Suspected right carpal tunnel syndrome Numbness and tingling in the right hand and arm, worsening at night. Symptoms began post-pneumonia vaccine, unlikely related. + Phalen sign. - Recommended wearing a wrist splint at night to immobilize the wrist. - Provided wrist exercises - Advised follow-up if symptoms persist after a month for potential referral for steroid injection.   Meds ordered this encounter  Medications   predniSONE  (DELTASONE ) 20 MG tablet    Sig: Take 2 tablets (40 mg total) by mouth daily with breakfast for 5 days.    Dispense:  10 tablet    Refill:  0     Return in about 1 year (around 08/31/2025) for Annual Physical Exam.      Isaiah DELENA Pepper, MD  [facility] [phone] (phone) [phone] (fax)
